# Patient Record
Sex: Male | Born: 1955
Health system: Southern US, Community
[De-identification: ages and names within clinical notes are randomized; demographics above are authoritative.]

## PROBLEM LIST (undated history)

## (undated) DIAGNOSIS — F419 Anxiety disorder, unspecified: Secondary | ICD-10-CM

## (undated) DIAGNOSIS — D751 Secondary polycythemia: Secondary | ICD-10-CM

## (undated) DIAGNOSIS — E291 Testicular hypofunction: Secondary | ICD-10-CM

## (undated) DIAGNOSIS — K760 Fatty (change of) liver, not elsewhere classified: Secondary | ICD-10-CM

## (undated) DIAGNOSIS — E785 Hyperlipidemia, unspecified: Secondary | ICD-10-CM

## (undated) DIAGNOSIS — I10 Essential (primary) hypertension: Secondary | ICD-10-CM

## (undated) DIAGNOSIS — F32A Depression, unspecified: Secondary | ICD-10-CM

## (undated) DIAGNOSIS — F1010 Alcohol abuse, uncomplicated: Secondary | ICD-10-CM

## (undated) DIAGNOSIS — M1990 Unspecified osteoarthritis, unspecified site: Secondary | ICD-10-CM

## (undated) DIAGNOSIS — F329 Major depressive disorder, single episode, unspecified: Secondary | ICD-10-CM

## (undated) DIAGNOSIS — N400 Enlarged prostate without lower urinary tract symptoms: Secondary | ICD-10-CM

## (undated) HISTORY — DX: Anxiety disorder, unspecified: F41.9

---

## 2004-09-09 ENCOUNTER — Ambulatory Visit: Payer: Self-pay | Admitting: Family Medicine

## 2004-09-30 ENCOUNTER — Ambulatory Visit: Payer: Self-pay | Admitting: Family Medicine

## 2004-10-30 ENCOUNTER — Ambulatory Visit: Payer: Self-pay | Admitting: Family Medicine

## 2005-07-01 ENCOUNTER — Ambulatory Visit: Payer: Self-pay | Admitting: Family Medicine

## 2005-07-04 ENCOUNTER — Ambulatory Visit: Payer: Self-pay | Admitting: Family Medicine

## 2005-10-14 ENCOUNTER — Ambulatory Visit: Payer: Self-pay | Admitting: Internal Medicine

## 2005-10-21 ENCOUNTER — Ambulatory Visit: Payer: Self-pay | Admitting: Internal Medicine

## 2005-12-09 ENCOUNTER — Ambulatory Visit: Payer: Self-pay | Admitting: Family Medicine

## 2006-04-08 ENCOUNTER — Encounter: Payer: Self-pay | Admitting: Internal Medicine

## 2006-04-08 DIAGNOSIS — F17200 Nicotine dependence, unspecified, uncomplicated: Secondary | ICD-10-CM

## 2006-04-09 DIAGNOSIS — F411 Generalized anxiety disorder: Secondary | ICD-10-CM | POA: Insufficient documentation

## 2006-05-14 ENCOUNTER — Ambulatory Visit: Payer: Self-pay | Admitting: Family Medicine

## 2006-05-14 DIAGNOSIS — I10 Essential (primary) hypertension: Secondary | ICD-10-CM

## 2006-05-21 ENCOUNTER — Ambulatory Visit: Payer: Self-pay | Admitting: Family Medicine

## 2006-05-25 LAB — CONVERTED CEMR LAB
AST: 23 units/L (ref 0–37)
Bilirubin, Direct: 0.1 mg/dL (ref 0.0–0.3)
CO2: 30 meq/L (ref 19–32)
Chloride: 106 meq/L (ref 96–112)
Eosinophils Absolute: 0.1 10*3/uL (ref 0.0–0.6)
Eosinophils Relative: 1.6 % (ref 0.0–5.0)
GFR calc non Af Amer: 84 mL/min
Glucose, Bld: 111 mg/dL — ABNORMAL HIGH (ref 70–99)
HCT: 49 % (ref 39.0–52.0)
Lymphocytes Relative: 22 % (ref 12.0–46.0)
MCV: 97.8 fL (ref 78.0–100.0)
Neutro Abs: 6.3 10*3/uL (ref 1.4–7.7)
Neutrophils Relative %: 68 % (ref 43.0–77.0)
RBC: 5.02 M/uL (ref 4.22–5.81)
Sodium: 143 meq/L (ref 135–145)
Testosterone: 340.83 ng/dL — ABNORMAL LOW (ref 350.00–890)
Total Protein: 6.6 g/dL (ref 6.0–8.3)
WBC: 9.2 10*3/uL (ref 4.5–10.5)

## 2006-06-15 ENCOUNTER — Encounter (INDEPENDENT_AMBULATORY_CARE_PROVIDER_SITE_OTHER): Payer: Self-pay | Admitting: Internal Medicine

## 2006-08-14 ENCOUNTER — Encounter (INDEPENDENT_AMBULATORY_CARE_PROVIDER_SITE_OTHER): Payer: Self-pay | Admitting: Internal Medicine

## 2006-08-24 ENCOUNTER — Ambulatory Visit: Payer: Self-pay | Admitting: Family Medicine

## 2006-09-01 ENCOUNTER — Ambulatory Visit: Payer: Self-pay

## 2006-09-01 ENCOUNTER — Encounter (INDEPENDENT_AMBULATORY_CARE_PROVIDER_SITE_OTHER): Payer: Self-pay | Admitting: Internal Medicine

## 2006-09-09 ENCOUNTER — Ambulatory Visit: Payer: Self-pay | Admitting: Internal Medicine

## 2006-10-07 ENCOUNTER — Ambulatory Visit: Payer: Self-pay | Admitting: Family Medicine

## 2006-10-19 ENCOUNTER — Ambulatory Visit: Payer: Self-pay | Admitting: Family Medicine

## 2006-10-20 ENCOUNTER — Encounter (INDEPENDENT_AMBULATORY_CARE_PROVIDER_SITE_OTHER): Payer: Self-pay | Admitting: Internal Medicine

## 2006-10-23 ENCOUNTER — Telehealth (INDEPENDENT_AMBULATORY_CARE_PROVIDER_SITE_OTHER): Payer: Self-pay | Admitting: Internal Medicine

## 2006-11-09 ENCOUNTER — Telehealth (INDEPENDENT_AMBULATORY_CARE_PROVIDER_SITE_OTHER): Payer: Self-pay | Admitting: Internal Medicine

## 2007-02-23 ENCOUNTER — Ambulatory Visit: Payer: Self-pay | Admitting: Family Medicine

## 2007-03-01 ENCOUNTER — Encounter (INDEPENDENT_AMBULATORY_CARE_PROVIDER_SITE_OTHER): Payer: Self-pay | Admitting: Internal Medicine

## 2007-03-08 ENCOUNTER — Encounter: Payer: Self-pay | Admitting: Family Medicine

## 2007-04-08 ENCOUNTER — Encounter (INDEPENDENT_AMBULATORY_CARE_PROVIDER_SITE_OTHER): Payer: Self-pay | Admitting: Internal Medicine

## 2007-10-07 ENCOUNTER — Encounter (INDEPENDENT_AMBULATORY_CARE_PROVIDER_SITE_OTHER): Payer: Self-pay | Admitting: Internal Medicine

## 2007-10-11 ENCOUNTER — Ambulatory Visit: Payer: Self-pay | Admitting: Family Medicine

## 2007-11-09 ENCOUNTER — Telehealth (INDEPENDENT_AMBULATORY_CARE_PROVIDER_SITE_OTHER): Payer: Self-pay | Admitting: Internal Medicine

## 2007-11-10 ENCOUNTER — Ambulatory Visit: Payer: Self-pay | Admitting: Family Medicine

## 2008-01-14 ENCOUNTER — Ambulatory Visit: Payer: Self-pay | Admitting: Oncology

## 2008-02-07 ENCOUNTER — Ambulatory Visit: Payer: Self-pay | Admitting: Oncology

## 2008-02-14 ENCOUNTER — Ambulatory Visit: Payer: Self-pay | Admitting: Oncology

## 2008-03-13 ENCOUNTER — Ambulatory Visit: Payer: Self-pay | Admitting: Oncology

## 2008-08-24 ENCOUNTER — Ambulatory Visit: Payer: Self-pay | Admitting: Family Medicine

## 2008-08-24 DIAGNOSIS — J309 Allergic rhinitis, unspecified: Secondary | ICD-10-CM | POA: Insufficient documentation

## 2008-08-24 DIAGNOSIS — J342 Deviated nasal septum: Secondary | ICD-10-CM | POA: Insufficient documentation

## 2008-09-08 ENCOUNTER — Encounter (INDEPENDENT_AMBULATORY_CARE_PROVIDER_SITE_OTHER): Payer: Self-pay | Admitting: Internal Medicine

## 2008-10-13 ENCOUNTER — Ambulatory Visit: Payer: Self-pay | Admitting: Internal Medicine

## 2008-10-19 ENCOUNTER — Encounter (INDEPENDENT_AMBULATORY_CARE_PROVIDER_SITE_OTHER): Payer: Self-pay | Admitting: Internal Medicine

## 2008-10-26 ENCOUNTER — Ambulatory Visit: Payer: Self-pay | Admitting: Family Medicine

## 2008-10-26 DIAGNOSIS — E291 Testicular hypofunction: Secondary | ICD-10-CM

## 2008-10-26 DIAGNOSIS — R7989 Other specified abnormal findings of blood chemistry: Secondary | ICD-10-CM | POA: Insufficient documentation

## 2008-11-02 ENCOUNTER — Encounter (INDEPENDENT_AMBULATORY_CARE_PROVIDER_SITE_OTHER): Payer: Self-pay | Admitting: Internal Medicine

## 2008-11-06 ENCOUNTER — Ambulatory Visit: Payer: Self-pay | Admitting: Oncology

## 2008-11-13 ENCOUNTER — Ambulatory Visit: Payer: Self-pay | Admitting: Oncology

## 2008-11-13 ENCOUNTER — Ambulatory Visit: Payer: Self-pay | Admitting: Internal Medicine

## 2008-12-13 ENCOUNTER — Ambulatory Visit: Payer: Self-pay | Admitting: Internal Medicine

## 2008-12-13 ENCOUNTER — Ambulatory Visit: Payer: Self-pay | Admitting: Oncology

## 2009-05-02 ENCOUNTER — Encounter: Payer: Self-pay | Admitting: Family Medicine

## 2009-08-14 ENCOUNTER — Telehealth (INDEPENDENT_AMBULATORY_CARE_PROVIDER_SITE_OTHER): Payer: Self-pay | Admitting: *Deleted

## 2009-08-15 ENCOUNTER — Ambulatory Visit: Payer: Self-pay | Admitting: Family Medicine

## 2009-08-17 LAB — CONVERTED CEMR LAB
ALT: 84 units/L — ABNORMAL HIGH (ref 0–53)
AST: 41 units/L — ABNORMAL HIGH (ref 0–37)
Alkaline Phosphatase: 44 units/L (ref 39–117)
Bilirubin, Direct: 0.2 mg/dL (ref 0.0–0.3)
CO2: 29 meq/L (ref 19–32)
Chloride: 102 meq/L (ref 96–112)
Cholesterol: 226 mg/dL — ABNORMAL HIGH (ref 0–200)
Potassium: 4.3 meq/L (ref 3.5–5.1)
Sodium: 141 meq/L (ref 135–145)
Total Bilirubin: 0.8 mg/dL (ref 0.3–1.2)
Total CHOL/HDL Ratio: 4
Total Protein: 6.5 g/dL (ref 6.0–8.3)

## 2009-08-21 ENCOUNTER — Ambulatory Visit: Payer: Self-pay | Admitting: Family Medicine

## 2009-08-21 DIAGNOSIS — R740 Nonspecific elevation of levels of transaminase and lactic acid dehydrogenase [LDH]: Secondary | ICD-10-CM

## 2009-08-21 DIAGNOSIS — E7800 Pure hypercholesterolemia, unspecified: Secondary | ICD-10-CM

## 2009-08-29 ENCOUNTER — Telehealth: Payer: Self-pay | Admitting: Family Medicine

## 2009-09-04 ENCOUNTER — Encounter: Payer: Self-pay | Admitting: Family Medicine

## 2009-09-04 ENCOUNTER — Ambulatory Visit: Payer: Self-pay

## 2009-09-12 ENCOUNTER — Ambulatory Visit: Payer: Self-pay | Admitting: Family Medicine

## 2009-10-02 ENCOUNTER — Ambulatory Visit: Payer: Self-pay | Admitting: Family Medicine

## 2009-10-04 ENCOUNTER — Telehealth: Payer: Self-pay | Admitting: Family Medicine

## 2009-10-31 ENCOUNTER — Ambulatory Visit: Payer: Self-pay | Admitting: Family Medicine

## 2009-11-01 ENCOUNTER — Telehealth: Payer: Self-pay | Admitting: Family Medicine

## 2009-12-03 ENCOUNTER — Encounter: Payer: Self-pay | Admitting: Family Medicine

## 2010-02-10 LAB — CONVERTED CEMR LAB
ALT: 72 units/L — ABNORMAL HIGH (ref 0–53)
Albumin: 4.3 g/dL (ref 3.5–5.2)
Basophils Relative: 0.7 % (ref 0.0–3.0)
Eosinophils Absolute: 0.1 10*3/uL (ref 0.0–0.7)
Eosinophils Relative: 1.4 % (ref 0.0–5.0)
LDL Cholesterol: 152 mg/dL
LDL Cholesterol: 152 mg/dL
Lymphocytes Relative: 19.6 % (ref 12.0–46.0)
MCHC: 34.2 g/dL (ref 30.0–36.0)
MCV: 96 fL (ref 78.0–100.0)
Monocytes Absolute: 0.9 10*3/uL (ref 0.1–1.0)
Neutrophils Relative %: 68.3 % (ref 43.0–77.0)
Platelets: 278 10*3/uL (ref 150.0–400.0)
RBC: 4.98 M/uL (ref 4.22–5.81)
Total Protein: 6.8 g/dL (ref 6.0–8.3)
WBC: 9.3 10*3/uL (ref 4.5–10.5)

## 2010-02-12 NOTE — Progress Notes (Signed)
Summary: FYI IFOB not returned  Phone Note Outgoing Call   Call placed by: Celso Sickle Call placed to: Patient Summary of Call: Recieved notice that IFOB kit has not been returned. I spoke with pt he says he just forgot to do it and will return it asap.  Tasha  Initial call taken by: Liane Comber CMA Duncan Dull),  October 04, 2009 10:27 AM

## 2010-02-12 NOTE — Assessment & Plan Note (Signed)
Summary: CPX / LFW   Vital Signs:  Patient profile:   55 year old male Height:      66.25 inches Weight:      170.0 pounds BMI:     27.33 Temp:     98.9 degrees F oral Pulse rate:   88 / minute Pulse rhythm:   regular BP sitting:   140 / 80  (left arm) Cuff size:   regular  Vitals Entered By: Benny Lennert CMA Duncan Dull) (August 21, 2009 3:44 PM)  History of Present Illness: Chief complaint cpx  The patient is here for annual wellness exam and preventative care.    Hypogonadism..seen by urology, on testosterone injections.  Feels much better on these injections.  Last PSA 04/2009...nml   Brown skin rash on face and back, left two dark spots on left face. Noted 6 months ago.  Has history of BP elevation, no dx of HTN... 156/96. Has had multiple measurments that high.  No regular exercise, central obesity.  Stress test , EKG no ischemia in 2008.    Has eight bears on weekends.   2-3 drinks daily.   Problems Prior to Update: 1)  Abnormal Electrocardiogram, ? R Vent Hypertrophy  (ICD-794.31) 2)  Transaminases, Serum, Elevated  (ICD-790.4) 3)  Prediabetes  (ICD-790.29) 4)  Screening For Lipoid Disorders  (ICD-V77.91) 5)  Hypogonadism  (ICD-257.2) 6)  Deviated Nasal Septum  (ICD-470) 7)  Allergic Rhinitis  (ICD-477.9) 8)  Erectile Dysfunction/ Dr Achilles Dunk  (ICD-302.72) 9)  Hx, Family, Other Cardiovascular Disease  (ICD-V17.49) 10)  Hypertension, Secondary, Benign  (ICD-405.19) 11)  Decreased Libido  (ICD-799.81) 12)  Anxiety  (ICD-300.00) 13)  Hx of Bronchitis, Chronic  (ICD-491.9) 14)  Tobacco Abuse  (ICD-305.1)  Current Medications (verified): 1)  Depo-Testosterone 100 Mg/ml Oil (Testosterone Cypionate) .... Q 2 Wks  Allergies: 1)  ! * Lexapro 2)  ! Buspar 3)  ! * Amitriptyline 4)  ! Wellbutrin  Past History:  Past medical, surgical, family and social histories (including risk factors) reviewed, and no changes noted (except as noted below).  Past Medical  History: Reviewed history from 04/08/2006 and no changes required. Anxiety  Family History: Reviewed history from 02/23/2007 and no changes required. Father:  Mother: died at 71 of MI, DM Siblings:1 br--bypass at 61              4 sis   DM- MI- CVA-  Prostate Cancer- Breast Cancer- Ovarian Cancer- Uterine Cancer- Colon Cancer- Drug/ ETOH Abuse- Depression-   Social History: Reviewed history from 02/23/2007 and no changes required. Marital Status: Married Children: 0 Occupation: Airline pilot  Review of Systems General:  Denies fatigue and fever. CV:  Denies chest pain or discomfort and swelling of feet. Resp:  Denies shortness of breath. GI:  Denies abdominal pain. GU:  Denies dysuria. Derm:  Complains of rash. Psych:  Complains of anxiety; denies depression. Endo:  Denies cold intolerance, excessive thirst, and heat intolerance.  Physical Exam  General:  Well-developed,well-nourished,in no acute distress; alert,appropriate and cooperative throughout examination Ears:  External ear exam shows no significant lesions or deformities.  Otoscopic examination reveals clear canals, tympanic membranes are intact bilaterally without bulging, retraction, inflammation or discharge. Hearing is grossly normal bilaterally. Nose:  External nasal examination shows no deformity or inflammation. Nasal mucosa are pink and moist without lesions or exudates. Mouth:  Oral mucosa and oropharynx without lesions or exudates.  Teeth in good repair. Neck:  no carotid bruit or thyromegaly no cervical or supraclavicular  lymphadenopathy  Lungs:  Normal respiratory effort, chest expands symmetrically. Lungs are clear to auscultation, no crackles or wheezes. Heart:  Normal rate and regular rhythm. S1 and S2 normal without gallop, murmur, click, rub or other extra sounds. Abdomen:  Bowel sounds positive,abdomen soft and non-tender without masses, organomegaly or hernias noted. Rectal:  No external  abnormalities noted. Normal sphincter tone. No rectal masses or tenderness. Genitalia:  Testes bilaterally descended without nodularity, tenderness or masses. No scrotal masses or lesions. No penis lesions or urethral discharge. Prostate:  Prostate gland firm and smooth, no enlargement, nodularity, tenderness, mass, asymmetry or induration. Pulses:  R and L posterior tibial pulses are full and equal bilaterally  Extremities:  no edema  Skin:  Brown plaque on face and back, left two darker spots on left face. Warty texture, stuck on appearance. Psych:  Cognition and judgment appear intact. Alert and cooperative with normal attention span and concentration. No apparent delusions, illusions, hallucinations   Impression & Recommendations:  Problem # 1:  Preventive Health Care (ICD-V70.0) The patient's preventative maintenance and recommended screening tests for an annual wellness exam were reviewed in full today. Brought up to date unless services declined.  Counselled on the importance of diet, exercise, and its role in overall health and mortality. The patient's FH and SH was reviewed, including their home life, tobacco status, and drug and alcohol status.     Problem # 2:  HYPERTENSION, SECONDARY, BENIGN (ICD-405.19) New diagnosis. Eval for secondary causes, end organ damage and risk factors.  Get BP cuff..measure BPS at home, goal <140/90.  Orders: EKG w/ Interpretation (93000) TLB-Hepatic/Liver Function Pnl (80076-HEPATIC) TLB-TSH (Thyroid Stimulating Hormone) (84443-TSH) TLB-CBC Platelet - w/Differential (85025-CBCD)  Problem # 3:  ABNORMAL ELECTROCARDIOGRAM, ? R VENT HYPERTROPHY (ICD-794.31)  Orders: Echo Referral (Echo)  Problem # 4:  TRANSAMINASES, SERUM, ELEVATED (ICD-790.4) Discussed decreasing ETOH beverages. Avoiding tylenol. Recehk in 3 months.Marland Kitchenif persistantly elevated eval further.   Hepatic Profile:reviewed last results: SGPT: 84 (08/15/2009 9:23:37 AM) SGOT:41  (08/15/2009 9:23:37 AM) Alk Phos: 44 (08/15/2009 9:23:37 AM) Indirect bilirubin:  Direct bilirubin: 0.2 (08/15/2009 9:23:37 AM) Total bilirubin: 0.8 (08/15/2009 9:23:37 AM) Total protein: 6.5 (08/15/2009 9:23:37 AM)   Problem # 5:  PREDIABETES (ICD-790.29)  Orders: EKG w/ Interpretation (93000) TLB-Hepatic/Liver Function Pnl (80076-HEPATIC) TLB-TSH (Thyroid Stimulating Hormone) (84443-TSH) TLB-CBC Platelet - w/Differential (85025-CBCD)  Labs Reviewed: Creat: 0.8 (08/15/2009)     Problem # 6:  HYPERCHOLESTEROLEMIA (ICD-272.0) Encouraged exercise, weight loss, healthy eating habits. Reviewed low cholesterol diet.  Labs Reviewed: SGOT: 41 (08/15/2009)   SGPT: 84 (08/15/2009)   HDL:54.60 (08/15/2009), 51.5 (05/21/2006)  LDL:152 (08/15/2009), 152 (08/15/2009)  Chol:226 (08/15/2009), 223 (05/21/2006)  Trig:191.0 (08/15/2009), 121 (05/21/2006)  Complete Medication List: 1)  Depo-testosterone 100 Mg/ml Oil (Testosterone cypionate) .... Q 2 wks  Patient Instructions: 1)  Look into eating more 2 % cheeses, decrease fried foods. 2)  Make diet changes to lower chol., low animal fats. 3)  Low salt diet. 4)  Start  exercise.Marland Kitchengoal 3-5 times a week, 20-30 min. 5)  Decrease alcoholic beverages. 6)  Recheck fasting LIPIDS, CMET  in 3 months Dx 272.0  7)  Please schedule a follow-up appointment in 2 weeks HTN 30 min.  8)  Get BP cuff..measure BPS at home, goal <140/90. 9)  Referral Appointment Information 10)  Day/Date: 11)  Time: 12)  Place/MD: 13)  Address: 14)  Phone/Fax: 15)  Patient given appointment information. Information/Orders faxed/mailed.   Current Allergies (reviewed today): ! * LEXAPRO ! BUSPAR ! *  AMITRIPTYLINE ! WELLBUTRIN  Last HDL:  54.60 (08/15/2009 9:23:37 AM) HDL Next Due:  12 wk Last LDL:  DEL (05/21/2006 8:51:00 AM) LDL Result Date:  08/15/2009 LDL Result:  152 LDL Next Due:  12 wk Flex Sig Next Due:  Not Indicated Colonoscopy Next Due:  Refused PSA Result  Date:  04/13/2009 PSA Result:  per urology PSA Next Due:  1 yr

## 2010-02-12 NOTE — Letter (Signed)
Summary: [facility]   Imported By: Lanelle Bal 12/13/2009 11:57:40  _____________________________________________________________________  External Attachment:    Type:   Image     Comment:   External Document

## 2010-02-12 NOTE — Progress Notes (Signed)
  Phone Note Outgoing Call   Call placed by: twalsh Call placed to: Patient Details for Reason: ifob not returned Summary of Call: Ifob never returned, per [facility]. This was his second kit  Test cancelled. Initial call taken by: Mills Koller,  November 01, 2009 3:31 PM     Hemoccult Next Due:  Refused

## 2010-02-12 NOTE — Letter (Signed)
Summary: [facility]   Imported By: Lanelle Bal 05/08/2009 10:45:58  _____________________________________________________________________  External Attachment:    Type:   Image     Comment:   External Document

## 2010-02-12 NOTE — Progress Notes (Signed)
----   Converted from flag ---- ---- 08/14/2009 2:00 PM, Kerby Nora MD wrote: CMET, lipids Dx v77.91   ---- 08/14/2009 1:23 PM, Liane Comber CMA (AAMA) wrote: Pt is scheduled for cpx labs tomorrow, what labs to draw and dx codes? Thanks Tasha ------------------------------

## 2010-02-12 NOTE — Progress Notes (Signed)
Summary: regarding lisinopril/ hctz  Phone Note From Pharmacy   Caller: [facility] Summary of Call: Pharmacist called regarding lisinopril strength.  He says this does not come in 10/25.  It comes in 10/12.5 and 20/25.  Please advise on what to change to. Initial call taken by: Lowella Petties CMA,  August 29, 2009 2:50 PM  Follow-up for Phone Call        dr Ermalene Searing verified 10/12.5 Follow-up by: Benny Lennert CMA Duncan Dull),  August 29, 2009 3:08 PM

## 2010-02-12 NOTE — Assessment & Plan Note (Signed)
Summary: ROA 30 MINUTES IN 2 MTHS CYD   Vital Signs:  Patient profile:   55 year old male Height:      66.25 inches Weight:      170 pounds BMI:     27.33 Temp:     98.4 degrees F oral Pulse rate:   76 / minute Pulse rhythm:   regular BP sitting:   110 / 60  (left arm) Cuff size:   regular  Vitals Entered By: Linde Gillis CMA Duncan Dull) (September 12, 2009 3:25 PM) CC: return office visit, 30 minute appt, Hypertension Management   History of Present Illness: HTN..improved control on lisinopril HCTZ low dose...has been on this for 2 weeks.  BPs 126/76-143/74 Headches better. No SE to medication.  Feels better overall.  Sleeping better at night.  ECHO nml..nml %EF  LFTs improved last check ..on less ETOH...not nml though.  Rechk in 3 months.   Hypertension History:      Positive major cardiovascular risk factors include male age 69 years old or older, hyperlipidemia, hypertension, and current tobacco user.     Problems Prior to Update: 1)  Hypercholesterolemia  (ICD-272.0) 2)  Preventive Health Care  (ICD-V70.0) 3)  Abnormal Electrocardiogram, ? R Vent Hypertrophy  (ICD-794.31) 4)  Transaminases, Serum, Elevated  (ICD-790.4) 5)  Prediabetes  (ICD-790.29) 6)  Screening For Lipoid Disorders  (ICD-V77.91) 7)  Hypogonadism  (ICD-257.2) 8)  Deviated Nasal Septum  (ICD-470) 9)  Allergic Rhinitis  (ICD-477.9) 10)  Erectile Dysfunction/ Dr Achilles Dunk  (ICD-302.72) 11)  Hx, Family, Other Cardiovascular Disease  (ICD-V17.49) 12)  Hypertension, Secondary, Benign  (ICD-405.19) 13)  Decreased Libido  (ICD-799.81) 14)  Anxiety  (ICD-300.00) 15)  Hx of Bronchitis, Chronic  (ICD-491.9) 16)  Tobacco Abuse  (ICD-305.1)  Current Medications (verified): 1)  Depo-Testosterone 100 Mg/ml Oil (Testosterone Cypionate) .... Q 2 Wks 2)  Prinzide 10-12.5 Mg Tabs (Lisinopril-Hydrochlorothiazide) .... Take One Tablet By Mouth Daily  Allergies: 1)  ! * Lexapro 2)  ! Buspar 3)  ! * Amitriptyline 4)   ! Wellbutrin  Past History:  Past medical, surgical, family and social histories (including risk factors) reviewed, and no changes noted (except as noted below).  Past Medical History: Reviewed history from 04/08/2006 and no changes required. Anxiety  Family History: Reviewed history from 02/23/2007 and no changes required. Father:  Mother: died at 2 of MI, DM Siblings:1 br--bypass at 28              4 sis   DM- MI- CVA-  Prostate Cancer- Breast Cancer- Ovarian Cancer- Uterine Cancer- Colon Cancer- Drug/ ETOH Abuse- Depression-   Social History: Reviewed history from 02/23/2007 and no changes required. Marital Status: Married Children: 0 Occupation: Oncologist Exam  General:  Well-developed,well-nourished,in no acute distress; alert,appropriate and cooperative throughout examination Mouth:  Oral mucosa and oropharynx without lesions or exudates.  Teeth in good repair. Neck:  no carotid bruit or thyromegaly no cervical or supraclavicular lymphadenopathy  Lungs:  Normal respiratory effort, chest expands symmetrically. Lungs are clear to auscultation, no crackles or wheezes. Heart:  Normal rate and regular rhythm. S1 and S2 normal without gallop, murmur, click, rub or other extra sounds. Pulses:  R and L posterior tibial pulses are full and equal bilaterally  Extremities:  no edema    Impression & Recommendations:  Problem # 1:  HYPERTENSION, SECONDARY, BENIGN (ICD-405.19) Improved on current medication. Encouraged exercise, weight loss, healthy eating habits.  His updated medication list for this problem includes:  Prinzide 10-12.5 Mg Tabs (Lisinopril-hydrochlorothiazide) .Marland Kitchen... Take one tablet by mouth daily  Problem # 2:  TRANSAMINASES, SERUM, ELEVATED (ICD-790.4) IMproved with decrease in LFTs. Continue to recehck in 3 months.  Problem # 3:  ABNORMAL ELECTROCARDIOGRAM, ? R VENT HYPERTROPHY (ICD-794.31) No hypertrophy on ECHO. Nml EF%  Complete  Medication List: 1)  Depo-testosterone 100 Mg/ml Oil (Testosterone cypionate) .... Q 2 wks 2)  Prinzide 10-12.5 Mg Tabs (Lisinopril-hydrochlorothiazide) .... Take one tablet by mouth daily  Other Orders: Admin 1st Vaccine (04540) Flu Vaccine 74yrs + (98119)  Hypertension Assessment/Plan:      The patient's hypertensive risk group is category B: At least one risk factor (excluding diabetes) with no target organ damage.  His calculated 10 year risk of coronary heart disease is 11 %.  Today's blood pressure is 110/60.  His blood pressure goal is < 140/90.  Patient Instructions: 1)  Look into eating more 2 % cheeses, decrease fried foods. 2)  Make diet changes to lower chol., low animal fats. 3)   Low salt diet. 4)  Start  exercise.Marland Kitchengoal 3-5 times a week, 20-30 min. 5)   Decrease alcoholic beverages. 6)   Recheck fasting LIPIDS, CMET  in 3 months Dx 272.0    Flu Vaccine Consent Questions     Do you have a history of severe allergic reactions to this vaccine? no    Any prior history of allergic reactions to egg and/or gelatin? no    Do you have a sensitivity to the preservative Thimersol? no    Do you have a past history of Guillan-Barre Syndrome? no    Do you currently have an acute febrile illness? no    Have you ever had a severe reaction to latex? no    Vaccine information given and explained to patient? yes    Are you currently pregnant? no    Lot Number:[id number]   Exp Date:07/13/2010   Site Given  Left Deltoid IM  Current Allergies (reviewed today): ! * LEXAPRO ! BUSPAR ! * AMITRIPTYLINE ! WELLBUTRIN  .lbflu

## 2010-02-12 NOTE — Progress Notes (Signed)
Summary: BP has been elevated  Phone Note Call from Patient Call back at Work Phone [phone]   Caller: Patient Summary of Call: Pt reports that BP has been up the last few day-.  Readings have been 176/96, 167/96, and 205/100.  He was told to track his BP and let you know.  Uses [facility] [address]. Initial call taken by: Lowella Petties CMA,  August 29, 2009 11:38 AM  Follow-up for Phone Call        Recommend starting a medication...lisinopril HCTZ 10/25 1 tab by mouth daily. #30, 3 RF  Continue to follow BP. Come in for BMET in 7-10 day Dx 401.1 to make sure no changes in kidney function with new med.  AMke follow up BP check appt in 2-3 weeks.   Follow-up by: Kerby Nora MD,  August 29, 2009 2:11 PM  Additional Follow-up for Phone Call Additional follow up Details #1::        Patient advised and rx called in Additional Follow-up by: Benny Lennert CMA Duncan Dull),  August 29, 2009 2:44 PM     Current Allergies (reviewed today): ! * LEXAPRO ! BUSPAR ! * AMITRIPTYLINE ! WELLBUTRIN

## 2010-02-21 ENCOUNTER — Ambulatory Visit (INDEPENDENT_AMBULATORY_CARE_PROVIDER_SITE_OTHER): Payer: Managed Care, Other (non HMO) | Admitting: Family Medicine

## 2010-02-21 ENCOUNTER — Encounter: Payer: Self-pay | Admitting: Family Medicine

## 2010-02-21 DIAGNOSIS — E7800 Pure hypercholesterolemia, unspecified: Secondary | ICD-10-CM

## 2010-02-21 DIAGNOSIS — R7309 Other abnormal glucose: Secondary | ICD-10-CM

## 2010-02-21 DIAGNOSIS — I159 Secondary hypertension, unspecified: Secondary | ICD-10-CM

## 2010-02-28 NOTE — Assessment & Plan Note (Signed)
Summary: BP MEDS REFILL / LFW   Vital Signs:  Patient profile:   55 year old male Height:      66.25 inches Weight:      170.75 pounds BMI:     27.45 Temp:     97.7 degrees F oral Pulse rate:   72 / minute Pulse rhythm:   regular BP sitting:   110 / 70  (left arm) Cuff size:   regular  Vitals Entered By: Benny Lennert CMA Duncan Dull) (February 21, 2010 11:24 AM)  History of Present Illness: Chief complaint recheck bp   HTN, well controlled on current meds.  Usually at home 110-136/70-88  Elevated LFTS.Marland Kitchen due for recheck.   Continues alcohol use but less now... but still when gets to gether with friends drinks a lot.. such as at [employer].   High cholesterol.. struggling with diet changes, exercsie 1-2 times a week.. walking.   Problems Prior to Update: 1)  Hypercholesterolemia  (ICD-272.0) 2)  Preventive Health Care  (ICD-V70.0) 3)  Abnormal Electrocardiogram, ? R Vent Hypertrophy  (ICD-794.31) 4)  Transaminases, Serum, Elevated  (ICD-790.4) 5)  Prediabetes  (ICD-790.29) 6)  Screening For Lipoid Disorders  (ICD-V77.91) 7)  Hypogonadism  (ICD-257.2) 8)  Deviated Nasal Septum  (ICD-470) 9)  Allergic Rhinitis  (ICD-477.9) 10)  Erectile Dysfunction/ Dr Achilles Dunk  (ICD-302.72) 11)  Hx, Family, Other Cardiovascular Disease  (ICD-V17.49) 12)  Hypertension, Secondary, Benign  (ICD-405.19) 13)  Decreased Libido  (ICD-799.81) 14)  Anxiety  (ICD-300.00) 15)  Hx of Bronchitis, Chronic  (ICD-491.9) 16)  Tobacco Abuse  (ICD-305.1)  Current Medications (verified): 1)  Depo-Testosterone 100 Mg/ml Oil (Testosterone Cypionate) .... Q 2 Wks 2)  Prinzide 10-12.5 Mg Tabs (Lisinopril-Hydrochlorothiazide) .... Take One Tablet By Mouth Daily  Allergies: 1)  ! * Lexapro 2)  ! Buspar 3)  ! * Amitriptyline 4)  ! Wellbutrin  Past History:  Past medical, surgical, family and social histories (including risk factors) reviewed, and no changes noted (except as noted below).  Past Medical  History: Reviewed history from 04/08/2006 and no changes required. Anxiety  Family History: Reviewed history from 02/23/2007 and no changes required. Father:  Mother: died at 55 of MI, DM Siblings:1 br--bypass at 47              4 sis   DM- MI- CVA-  Prostate Cancer- Breast Cancer- Ovarian Cancer- Uterine Cancer- Colon Cancer- Drug/ ETOH Abuse- Depression-   Social History: Reviewed history from 02/23/2007 and no changes required. Marital Status: Married Children: 0 Occupation: Airline pilot  Review of Systems General:  Denies fatigue and fever. CV:  Denies chest pain or discomfort. Resp:  Denies shortness of breath. GI:  Denies abdominal pain.  Physical Exam  General:  Overweight malel IN NAD.Marland Kitchen central obesity Nose:  External nasal examination shows deviated septum Nasal mucosa are pink and moist without lesions or exudates. Mouth:  MMM Neck:  no carotid bruit or thyromegaly no cervical or supraclavicular lymphadenopathy  Lungs:  Normal respiratory effort, chest expands symmetrically. Lungs are clear to auscultation, no crackles or wheezes. Heart:  Normal rate and regular rhythm. S1 and S2 normal without gallop, murmur, click, rub or other extra sounds. Abdomen:  Bowel sounds positive,abdomen soft and non-tender without masses, organomegaly or hernias noted. Central obesity Pulses:  R and L posterior tibial pulses are full and equal bilaterally  Extremities:  no edema   Impression & Recommendations:  Problem # 1:  HYPERTENSION, SECONDARY, BENIGN (ICD-405.19) Assessment Improved Well controlled. Continue  current medication.  His updated medication list for this problem includes:    Prinzide 10-12.5 Mg Tabs (Lisinopril-hydrochlorothiazide) .Marland Kitchen... Take one tablet by mouth daily  Problem # 2:  HYPERCHOLESTEROLEMIA (ICD-272.0) Due for reeval.   Problem # 3:  PREDIABETES (ICD-790.29) Due for reeval.   Problem # 4:  TRANSAMINASES, SERUM, ELEVATED (ICD-790.4) Due for  reeval.  Likely due to alcohol use/fatty liver.   Complete Medication List: 1)  Depo-testosterone 100 Mg/ml Oil (Testosterone cypionate) .... Q 2 wks 2)  Prinzide 10-12.5 Mg Tabs (Lisinopril-hydrochlorothiazide) .... Take one tablet by mouth daily  Patient Instructions: 1)  Fasting lipids, CMET Dx 272.0 NOW 2)  Schedule CPX in 08/2010  Prescriptions: PRINZIDE 10-12.5 MG TABS (LISINOPRIL-HYDROCHLOROTHIAZIDE) take one tablet by mouth daily  #30 Each x 11   Entered and Authorized by:   Kerby Nora MD   Signed by:   Kerby Nora MD on 02/21/2010   Method used:   Electronically to        [facility] (retail)       [address], [address]       [place], [place]  [address]       Ph: [phone]       Fax: [phone]   RxID:   [id number]    Orders Added: 1)  Est. Patient Level IV [16606]    Current Allergies (reviewed today): ! * LEXAPRO ! BUSPAR ! * AMITRIPTYLINE ! WELLBUTRIN

## 2010-08-06 ENCOUNTER — Encounter: Payer: Self-pay | Admitting: Cardiology

## 2010-11-16 ENCOUNTER — Telehealth: Payer: Self-pay | Admitting: Family Medicine

## 2010-11-16 DIAGNOSIS — I159 Secondary hypertension, unspecified: Secondary | ICD-10-CM

## 2010-11-16 DIAGNOSIS — Z125 Encounter for screening for malignant neoplasm of prostate: Secondary | ICD-10-CM

## 2010-11-16 DIAGNOSIS — E7800 Pure hypercholesterolemia, unspecified: Secondary | ICD-10-CM

## 2010-11-16 DIAGNOSIS — R7309 Other abnormal glucose: Secondary | ICD-10-CM

## 2010-11-16 NOTE — Telephone Encounter (Signed)
Message copied by Excell Seltzer on Sat Nov 16, 2010  9:14 AM ------      Message from: Alvina Chou      Created: Fri Nov 15, 2010  3:26 PM      Regarding: Labs for ZOXWRU,04-5       Patient is scheduled for CPX labs, please order future labs, Thanks , Camelia Eng

## 2010-11-18 ENCOUNTER — Other Ambulatory Visit (INDEPENDENT_AMBULATORY_CARE_PROVIDER_SITE_OTHER): Payer: Managed Care, Other (non HMO)

## 2010-11-18 DIAGNOSIS — E7800 Pure hypercholesterolemia, unspecified: Secondary | ICD-10-CM

## 2010-11-18 DIAGNOSIS — Z125 Encounter for screening for malignant neoplasm of prostate: Secondary | ICD-10-CM

## 2010-11-18 DIAGNOSIS — R7309 Other abnormal glucose: Secondary | ICD-10-CM

## 2010-11-18 DIAGNOSIS — I159 Secondary hypertension, unspecified: Secondary | ICD-10-CM

## 2010-11-18 LAB — LIPID PANEL
Cholesterol: 187 mg/dL (ref 0–200)
LDL Cholesterol: 121 mg/dL — ABNORMAL HIGH (ref 0–99)
Total CHOL/HDL Ratio: 4
Triglycerides: 103 mg/dL (ref 0.0–149.0)

## 2010-11-18 LAB — COMPREHENSIVE METABOLIC PANEL
ALT: 39 U/L (ref 0–53)
AST: 22 U/L (ref 0–37)
Albumin: 4 g/dL (ref 3.5–5.2)
BUN: 18 mg/dL (ref 6–23)
CO2: 30 mEq/L (ref 19–32)
Calcium: 9.7 mg/dL (ref 8.4–10.5)
Chloride: 102 mEq/L (ref 96–112)
Creatinine, Ser: 1 mg/dL (ref 0.4–1.5)
GFR: 83.58 mL/min (ref >60.00)
Potassium: 4.5 mEq/L (ref 3.5–5.1)

## 2010-11-25 ENCOUNTER — Encounter: Payer: Self-pay | Admitting: Family Medicine

## 2010-11-25 ENCOUNTER — Ambulatory Visit (INDEPENDENT_AMBULATORY_CARE_PROVIDER_SITE_OTHER): Payer: Managed Care, Other (non HMO) | Admitting: Family Medicine

## 2010-11-25 VITALS — BP 120/72 | HR 77 | Temp 97.9°F | Ht 68.0 in | Wt 167.0 lb

## 2010-11-25 DIAGNOSIS — E291 Testicular hypofunction: Secondary | ICD-10-CM

## 2010-11-25 DIAGNOSIS — E7800 Pure hypercholesterolemia, unspecified: Secondary | ICD-10-CM

## 2010-11-25 DIAGNOSIS — R7402 Elevation of levels of lactic acid dehydrogenase (LDH): Secondary | ICD-10-CM

## 2010-11-25 DIAGNOSIS — Z0000 Encounter for general adult medical examination without abnormal findings: Secondary | ICD-10-CM

## 2010-11-25 DIAGNOSIS — Z1211 Encounter for screening for malignant neoplasm of colon: Secondary | ICD-10-CM

## 2010-11-25 DIAGNOSIS — J309 Allergic rhinitis, unspecified: Secondary | ICD-10-CM

## 2010-11-25 DIAGNOSIS — R7309 Other abnormal glucose: Secondary | ICD-10-CM

## 2010-11-25 DIAGNOSIS — I159 Secondary hypertension, unspecified: Secondary | ICD-10-CM

## 2010-11-25 MED ORDER — FLUTICASONE PROPIONATE 50 MCG/ACT NA SUSP: 2.0000 | Freq: Every day | NASAL | Status: DC

## 2010-11-25 NOTE — Assessment & Plan Note (Signed)
Improved with ETOH decrease.

## 2010-11-25 NOTE — Assessment & Plan Note (Signed)
Continued issue. Counseled on diet changes, exercise and further weight loss.

## 2010-11-25 NOTE — Assessment & Plan Note (Signed)
Per Uro on testosterone patches.

## 2010-11-25 NOTE — Progress Notes (Signed)
Subjective:    Patient ID: Andrew Farrell, male    DOB: 1955-10-15, 55 y.o.   MRN: [id number]  HPI  The patient is here for annual wellness exam and preventative care.    Hypertension:   Well controlled on lisinopril/HCTZ. Using medication without problems or lightheadedness: None Chest pain with exertion: None Edema:None Short of breath:None Average home BPs: 140-143/83-84 at home. Other issues: Has felt great since quit smoking in last 2 years.  ECHO: nml last year, nml EF.  Alcohol.. Continued use.. 2 beers a day, not drinking as much on weekneds as in past.   Elevated Cholesterol: At goal now LDL <130, trig at goal. Diet compliance: Moderate, has lost 3 lbs in in last 6 months. Exercise:None Other complaints:  Nasal congestion severe, has deviated septum.. Limits sleeping at night. Using sinex now, has helped some. Has never tried nasal steroid in past.  Claritin has not helped in past.  Prediabetes:  Continues.  Hypogonadism..seen by urology, on testosterone injections.  Feels much better on these injections.  On vitamin E for curvature in penis.   Review of Systems  Constitutional: Negative for fever, fatigue and unexpected weight change.  HENT: Negative for ear pain, congestion, sore throat, rhinorrhea, trouble swallowing and postnasal drip.   Eyes: Negative for pain.  Respiratory: Negative for cough, shortness of breath and wheezing.   Cardiovascular: Negative for chest pain, palpitations and leg swelling.  Gastrointestinal: Negative for nausea, abdominal pain, diarrhea, constipation and blood in stool.  Genitourinary: Negative for dysuria, urgency, hematuria, discharge, penile swelling, scrotal swelling, difficulty urinating, penile pain and testicular pain.  Skin: Negative for rash.  Neurological: Negative for syncope, weakness, light-headedness, numbness and headaches.  Psychiatric/Behavioral: Negative for behavioral problems and dysphoric mood. The patient  is not nervous/anxious.        Objective:   Physical Exam  Constitutional: He is oriented to person, place, and time. He appears well-developed and well-nourished.  Non-toxic appearance. He does not appear ill. No distress.       Central obesity  HENT:  Head: Normocephalic and atraumatic.  Right Ear: Hearing, tympanic membrane, external ear and ear canal normal.  Left Ear: Hearing, tympanic membrane, external ear and ear canal normal.  Nose: Mucosal edema, rhinorrhea and septal deviation present. No nasal septal hematoma. Right sinus exhibits no maxillary sinus tenderness and no frontal sinus tenderness. Left sinus exhibits no maxillary sinus tenderness and no frontal sinus tenderness.  Mouth/Throat: Uvula is midline, oropharynx is clear and moist and mucous membranes are normal.  Eyes: Conjunctivae, EOM and lids are normal. Pupils are equal, round, and reactive to light. No foreign bodies found.  Neck: Trachea normal, normal range of motion and phonation normal. Neck supple. Carotid bruit is not present. No mass and no thyromegaly present.  Cardiovascular: Normal rate, regular rhythm, S1 normal, S2 normal, intact distal pulses and normal pulses.  Exam reveals no gallop.   No murmur heard. Pulmonary/Chest: Breath sounds normal. He has no wheezes. He has no rhonchi. He has no rales.  Abdominal: Soft. Normal appearance and bowel sounds are normal. There is no hepatosplenomegaly. There is no tenderness. There is no rebound, no guarding and no CVA tenderness. No hernia.  Genitourinary:       Per uro  Lymphadenopathy:    He has no cervical adenopathy.  Neurological: He is alert and oriented to person, place, and time. He has normal strength and normal reflexes. No cranial nerve deficit or sensory deficit. Gait normal.  Skin:  Skin is warm, dry and intact. No rash noted.  Psychiatric: He has a normal mood and affect. His speech is normal and behavior is normal. Judgment and thought content normal.           Assessment & Plan:  Complete Physical EXAM: The patient's preventative maintenance and recommended screening tests for an annual wellness exam were reviewed in full today. Brought up to date unless services declined.  Counselled on the importance of diet, exercise, and its role in overall health and mortality. The patient's FH and SH was reviewed, including their home life, tobacco status, and drug and alcohol status.   Vaccines: Flu given, Tdap:DUE, refused today. PSA: nml, rectal at U[facility]. Colon: Ready for colonoscopy. Will schedule.

## 2010-11-25 NOTE — Patient Instructions (Addendum)
Trial of generic flonase for nasal congestion.  Work on increasing exercise, weight loss and decreasing alcohol intake.  Decrease  Carbohydrates in diet.Marland Kitchen BREAD, pasta, pastry, sweets.  Stop at front to talk with Shirlee Limerick about referral for colonoscopy.  Follow up for CPX in 1 year with fasting labs prior.

## 2010-11-25 NOTE — Assessment & Plan Note (Signed)
Trial of nasal steroid for congestion. Significant issues from deviated septum, but not interested in repair at this time.

## 2010-11-25 NOTE — Assessment & Plan Note (Signed)
IMproved control, LDL now at goal with diet change.

## 2010-11-25 NOTE — Assessment & Plan Note (Signed)
Well controlled. Continue current medication.  

## 2011-03-18 ENCOUNTER — Ambulatory Visit: Payer: Self-pay | Admitting: Gastroenterology

## 2011-03-18 LAB — HM COLONOSCOPY: HM Colonoscopy: ABNORMAL

## 2011-03-24 ENCOUNTER — Other Ambulatory Visit: Payer: Self-pay | Admitting: Family Medicine

## 2011-03-25 ENCOUNTER — Encounter: Payer: Self-pay | Admitting: Family Medicine

## 2011-04-08 ENCOUNTER — Encounter: Payer: Self-pay | Admitting: Family Medicine

## 2012-02-16 ENCOUNTER — Other Ambulatory Visit: Payer: Self-pay | Admitting: Family Medicine

## 2012-06-22 ENCOUNTER — Ambulatory Visit (INDEPENDENT_AMBULATORY_CARE_PROVIDER_SITE_OTHER): Payer: BC Managed Care – PPO | Admitting: Family Medicine

## 2012-06-22 ENCOUNTER — Encounter: Payer: Self-pay | Admitting: Family Medicine

## 2012-06-22 VITALS — BP 140/80 | HR 85 | Temp 97.6°F | Ht 68.0 in | Wt 168.0 lb

## 2012-06-22 DIAGNOSIS — F411 Generalized anxiety disorder: Secondary | ICD-10-CM

## 2012-06-22 DIAGNOSIS — F329 Major depressive disorder, single episode, unspecified: Secondary | ICD-10-CM

## 2012-06-22 DIAGNOSIS — F3289 Other specified depressive episodes: Secondary | ICD-10-CM

## 2012-06-22 MED ORDER — LORAZEPAM 1 MG PO TABS: 1.0000 mg | ORAL_TABLET | Freq: Three times a day (TID) | ORAL | Status: DC

## 2012-06-22 MED ORDER — FLUOXETINE HCL 20 MG PO TABS: 20.0000 mg | ORAL_TABLET | Freq: Every day | ORAL | Status: DC

## 2012-06-22 NOTE — Progress Notes (Signed)
[facility] at [facility] [address] [place] [address] Phone: [phone] Fax: [phone]  Date:  06/22/2012   Name:  Andrew Farrell   DOB:  [birth year]   MRN:  [id number] Gender: male Age: 57 y.o.  Primary Physician:  Kerby Nora, MD  Evaluating MD: Hannah Beat, MD   Chief Complaint: Anxiety   History of Present Illness:  Andrew Farrell is a 57 y.o. pleasant patient who presents with the following: Even 6 mo. Having bad anxiety. Even when laying down. Sometimes feel like cannot breath. On and off for the last year. Dx with peyronie's. Lost job in December. Now tryin to. A lot of stress with wife. Wife is emotional. Always had some trouble sleeping. Sometimes panic attacks at night. Some guilt and decreased energy.     Patient Active Problem List   Diagnosis Date Noted  . HYPERCHOLESTEROLEMIA 08/21/2009  . PREDIABETES 08/21/2009  . TRANSAMINASES, SERUM, ELEVATED 08/21/2009  . HYPOGONADISM 10/26/2008  . DEVIATED NASAL SEPTUM 08/24/2008  . ALLERGIC RHINITIS 08/24/2008  . HYPERTENSION, SECONDARY, BENIGN 05/14/2006  . ANXIETY 04/09/2006  . TOBACCO ABUSE 04/08/2006    Past Medical History  Diagnosis Date  . Anxiety     No past surgical history on file.  History   Social History  . Marital Status: Married    Spouse Name: N/A    Number of Children: N/A  . Years of Education: N/A   Occupational History  . Not on file.   Social History Main Topics  . Smoking status: Former Games developer  . Smokeless tobacco: Not on file  . Alcohol Use: Not on file  . Drug Use: Not on file  . Sexually Active: Not on file   Other Topics Concern  . Not on file   Social History Narrative   Married, Airline pilot.     No family history on file.  Allergies  Allergen Reactions  . Bupropion Hcl     REACTION: unspecified  . Buspirone Hcl     REACTION: severe ha  . Escitalopram Oxalate     REACTION: made negative    Medication list has been reviewed  and updated.  Outpatient Prescriptions Prior to Visit  Medication Sig Dispense Refill  . lisinopril-hydrochlorothiazide (PRINZIDE,ZESTORETIC) 10-12.5 MG per tablet TAKE ONE TABLET BY MOUTH EVERY DAY  30 tablet  8  . fluticasone (FLONASE) 50 MCG/ACT nasal spray Place 2 sprays into the nose daily.  1 g  0  . testosterone cypionate (DEPO-TESTOSTERONE) 100 MG/ML injection Inject 100 mg into the muscle every 14 (fourteen) days.        . vitamin E 1000 UNIT capsule Take 1,000 Units by mouth daily.         No facility-administered medications prior to visit.    Review of Systems:   GEN: No acute illnesses, no fevers, chills. GI: No n/v/d, eating normally Pulm: No SOB Interactive and getting along well at home.  Otherwise, ROS is as per the HPI.   Physical Examination: BP 140/80  Pulse 85  Temp(Src) 97.6 F (36.4 C) (Oral)  Ht 5\' 8"  (1.727 m)  Wt 168 lb (76.204 kg)  BMI 25.55 kg/m2  SpO2 97%  Ideal Body Weight: Weight in (lb) to have BMI = 25: 164.1   GEN: WDWN, NAD, Non-toxic, Alert & Oriented x 3 HEENT: Atraumatic, Normocephalic.  Ears and Nose: No external deformity. EXTR: No clubbing/cyanosis/edema NEURO: Normal gait.  PSYCH: Normally interactive. Anxious.   Assessment and Plan:  ANXIETY  Depression  >25 minutes spent in face to face time with patient, >50% spent in counselling or coordination of care: depressed and anxious, feeling guilty.  Decreased energy. Poor sleep. No si or hi. Having some panic attacks. Worry about no job, peyronie's dz. Start prozac, for now prn ativan.   F/u Dr. B in 1 mo  Orders Today:  No orders of the defined types were placed in this encounter.    Updated Medication List: (Includes new medications, updates to list, dose adjustments) Meds ordered this encounter  Medications  . FLUoxetine (PROZAC) 20 MG tablet    Sig: Take 1 tablet (20 mg total) by mouth daily.    Dispense:  30 tablet    Refill:  1  . LORazepam (ATIVAN) 1 MG  tablet    Sig: Take 1 tablet (1 mg total) by mouth every 8 (eight) hours.    Dispense:  30 tablet    Refill:  0    Medications Discontinued: Medications Discontinued During This Encounter  Medication Reason  . fluticasone (FLONASE) 50 MCG/ACT nasal spray Error  . testosterone cypionate (DEPO-TESTOSTERONE) 100 MG/ML injection Error  . vitamin E 1000 UNIT capsule Error      Signed, Kai Calico T. Shavelle Runkel, MD 06/22/2012 12:39 PM

## 2012-07-14 ENCOUNTER — Encounter: Payer: Self-pay | Admitting: *Deleted

## 2012-07-15 ENCOUNTER — Encounter: Payer: Self-pay | Admitting: Family Medicine

## 2012-07-15 ENCOUNTER — Ambulatory Visit: Payer: BC Managed Care – PPO | Admitting: Family Medicine

## 2012-07-15 ENCOUNTER — Ambulatory Visit (INDEPENDENT_AMBULATORY_CARE_PROVIDER_SITE_OTHER): Payer: BC Managed Care – PPO | Admitting: Family Medicine

## 2012-07-15 ENCOUNTER — Ambulatory Visit: Payer: Self-pay | Admitting: Oncology

## 2012-07-15 VITALS — BP 130/74 | HR 92 | Temp 98.2°F | Ht 68.0 in | Wt 162.5 lb

## 2012-07-15 DIAGNOSIS — F411 Generalized anxiety disorder: Secondary | ICD-10-CM

## 2012-07-15 DIAGNOSIS — F325 Major depressive disorder, single episode, in full remission: Secondary | ICD-10-CM | POA: Insufficient documentation

## 2012-07-15 DIAGNOSIS — F329 Major depressive disorder, single episode, unspecified: Secondary | ICD-10-CM

## 2012-07-15 DIAGNOSIS — N486 Induration penis plastica: Secondary | ICD-10-CM

## 2012-07-15 NOTE — Assessment & Plan Note (Signed)
Improved

## 2012-07-15 NOTE — Assessment & Plan Note (Signed)
Pt is uncomfortable with lack of treatment options presnet with his current urologist.  We will refer to different  urologist

## 2012-07-15 NOTE — Patient Instructions (Addendum)
Continue prozac at current dose. Use ativan as needed.  Stop at lab on your way out.  Stop at front desk on your way out for referral to urologist.

## 2012-07-15 NOTE — Assessment & Plan Note (Addendum)
Modest improvement but pt does not want to increase medication at this time. Refuses referral for psychologist.  Use ativan prn.  Follow up closely for re-eval in 1 month.

## 2012-07-15 NOTE — Progress Notes (Signed)
  Subjective:    Patient ID: Andrew Farrell, male    DOB: 1955/03/12, 57 y.o.   MRN: [id number]  HPI  57 year old male presents for 1 month follow up of depression and anxiety: He started on prozac 20 mg and ativian prn at last OV with Dr. Patsy Lager. He has recently been unemployed, recent health issues. Today he reports  Some level of improvement from last OV... About 30% better. Depression component is much better. He is sleeping well. If he does not take ativan he gets anxious at 5 in afternoon. No specific panic attack. He is taking ativan daily.  Minimal SE from prozac.  No SI, no HI.  Refuses counsleing at this time.  he wishes to stay on same dose of medication for longer.      Review of Systems  Constitutional: Negative for fever and fatigue.  HENT: Negative for ear pain.   Eyes: Negative for pain.  Respiratory: Negative for shortness of breath.   Cardiovascular: Negative for chest pain, palpitations and leg swelling.  Gastrointestinal: Negative for abdominal pain.       Objective:   Physical Exam  Constitutional: Vital signs are normal. He appears well-developed and well-nourished.  HENT:  Head: Normocephalic.  Right Ear: Hearing normal.  Left Ear: Hearing normal.  Nose: Nose normal.  Mouth/Throat: Oropharynx is clear and moist and mucous membranes are normal.  Neck: Trachea normal. Carotid bruit is not present. No mass and no thyromegaly present.  Cardiovascular: Normal rate, regular rhythm and normal pulses.  Exam reveals no gallop, no distant heart sounds and no friction rub.   No murmur heard. No peripheral edema  Pulmonary/Chest: Effort normal and breath sounds normal. No respiratory distress.  Skin: Skin is warm, dry and intact. No rash noted.  Psychiatric: His speech is normal. Judgment and thought content normal. His mood appears anxious. His affect is angry. He is agitated. Cognition and memory are normal. He expresses no homicidal and no suicidal  ideation. He expresses no suicidal plans and no homicidal plans.          Assessment & Plan:

## 2012-07-18 ENCOUNTER — Other Ambulatory Visit: Payer: Self-pay | Admitting: Family Medicine

## 2012-07-19 ENCOUNTER — Ambulatory Visit: Payer: Self-pay | Admitting: Oncology

## 2012-07-19 NOTE — Telephone Encounter (Signed)
Ok 30, 0 ref 

## 2012-08-13 ENCOUNTER — Ambulatory Visit: Payer: Self-pay | Admitting: Oncology

## 2012-08-17 ENCOUNTER — Ambulatory Visit: Payer: BC Managed Care – PPO | Admitting: Family Medicine

## 2012-08-19 ENCOUNTER — Ambulatory Visit (INDEPENDENT_AMBULATORY_CARE_PROVIDER_SITE_OTHER): Payer: BC Managed Care – PPO | Admitting: Family Medicine

## 2012-08-19 ENCOUNTER — Encounter: Payer: Self-pay | Admitting: *Deleted

## 2012-08-19 ENCOUNTER — Encounter: Payer: Self-pay | Admitting: Family Medicine

## 2012-08-19 ENCOUNTER — Ambulatory Visit: Payer: BC Managed Care – PPO | Admitting: Family Medicine

## 2012-08-19 VITALS — BP 118/70 | HR 90 | Temp 98.4°F | Ht 68.0 in | Wt 161.0 lb

## 2012-08-19 DIAGNOSIS — F3289 Other specified depressive episodes: Secondary | ICD-10-CM

## 2012-08-19 DIAGNOSIS — F411 Generalized anxiety disorder: Secondary | ICD-10-CM

## 2012-08-19 DIAGNOSIS — F329 Major depressive disorder, single episode, unspecified: Secondary | ICD-10-CM

## 2012-08-19 MED ORDER — FLUOXETINE HCL 10 MG PO TABS: 10.0000 mg | ORAL_TABLET | Freq: Every day | ORAL | Status: DC

## 2012-08-19 NOTE — Patient Instructions (Addendum)
Taper off fluoxetine gradually over 2 weeks. Schedule CPX with fasting labs prior in next few months.

## 2012-08-19 NOTE — Progress Notes (Signed)
  Subjective:    Patient ID: Andrew Farrell, male    DOB: 04/28/1955, 57 y.o.   MRN: [id number]  HPI 57 year old male presents for follow up Anxiety/depression:  On fluoxetine 20 mg daily. He feels like he has slight headache constantly, feels med makes him confused, fatigued. He feels like mood is doing well. He has not used ativan often. He feels less anxious now that he has seen new urologist. He wishes to stop medication.  Discussed in detail recent finding of THC on UDS. He denies smoking any marijuana, but states he has been to parties, ? Second hand exposure. He is willing to have UDS repeated today, and would like to to clear this up even though no further controlled substances are needed at this point.  Urologist told him he does not have peyronie's disease. He is very happy, this has made a very big difference for him, not having this diagnosis. Ultrasound was negative. Has follow up on 8/18.    Review of Systems  Constitutional: Negative for fever and fatigue.  HENT: Negative for ear pain.   Eyes: Negative for pain.  Respiratory: Negative for shortness of breath.   Cardiovascular: Negative for chest pain, palpitations and leg swelling.  Gastrointestinal: Negative for abdominal pain.       Objective:   Physical Exam  Constitutional: Vital signs are normal. He appears well-developed and well-nourished.  HENT:  Head: Normocephalic.  Right Ear: Hearing normal.  Left Ear: Hearing normal.  Nose: Nose normal.  Mouth/Throat: Oropharynx is clear and moist and mucous membranes are normal.  Neck: Trachea normal. Carotid bruit is not present. No mass and no thyromegaly present.  Cardiovascular: Normal rate, regular rhythm and normal pulses.  Exam reveals no gallop, no distant heart sounds and no friction rub.   No murmur heard. No peripheral edema  Pulmonary/Chest: Effort normal and breath sounds normal. No respiratory distress.  Skin: Skin is warm, dry and intact. No  rash noted.  Psychiatric: He has a normal mood and affect. His speech is normal. Thought content normal. He is agitated.          Assessment & Plan:

## 2012-08-20 NOTE — Assessment & Plan Note (Signed)
Significant improvement in pt and he wishes to stop all medication given SE..  he refuses counseling and feels he is doing better.

## 2012-08-25 ENCOUNTER — Encounter: Payer: Self-pay | Admitting: Family Medicine

## 2012-08-26 ENCOUNTER — Telehealth: Payer: Self-pay

## 2012-08-26 NOTE — Telephone Encounter (Signed)
Already reported on UDS screen sheet. Was normal. Notify patient of results.

## 2012-08-26 NOTE — Telephone Encounter (Signed)
Pt left v/m requesting results of urine checked last week. Pt request cb at [phone].

## 2012-08-26 NOTE — Telephone Encounter (Signed)
Advised patient urine was normal.

## 2012-08-27 ENCOUNTER — Encounter: Payer: Self-pay | Admitting: Family Medicine

## 2012-08-31 LAB — CANCER CENTER HEMOGLOBIN: HGB: 17.3 g/dL (ref 13.0–18.0)

## 2012-09-13 ENCOUNTER — Ambulatory Visit: Payer: Self-pay | Admitting: Oncology

## 2012-09-17 ENCOUNTER — Telehealth: Payer: Self-pay | Admitting: *Deleted

## 2012-09-17 NOTE — Telephone Encounter (Signed)
Received fax from [facility] Non adherence Therapy Advisory Form.  Placed in Dr. Daphine Deutscher in box for review.

## 2012-11-18 ENCOUNTER — Other Ambulatory Visit: Payer: Self-pay

## 2012-12-06 ENCOUNTER — Other Ambulatory Visit: Payer: Self-pay | Admitting: Family Medicine

## 2013-03-24 ENCOUNTER — Ambulatory Visit: Payer: Self-pay | Admitting: Oncology

## 2013-03-25 LAB — CANCER CENTER HEMOGLOBIN: HGB: 17.2 g/dL (ref 13.0–18.0)

## 2013-04-13 ENCOUNTER — Ambulatory Visit: Payer: Self-pay | Admitting: Oncology

## 2013-06-07 ENCOUNTER — Ambulatory Visit (INDEPENDENT_AMBULATORY_CARE_PROVIDER_SITE_OTHER): Payer: BC Managed Care – PPO | Admitting: Family Medicine

## 2013-06-07 ENCOUNTER — Other Ambulatory Visit: Payer: Self-pay | Admitting: Family Medicine

## 2013-06-07 ENCOUNTER — Encounter: Payer: Self-pay | Admitting: Family Medicine

## 2013-06-07 VITALS — BP 106/62 | HR 92 | Temp 98.5°F | Wt 158.5 lb

## 2013-06-07 DIAGNOSIS — J019 Acute sinusitis, unspecified: Secondary | ICD-10-CM

## 2013-06-07 MED ORDER — AMOXICILLIN-POT CLAVULANATE 875-125 MG PO TABS: 1.0000 | ORAL_TABLET | Freq: Two times a day (BID) | ORAL | Status: DC

## 2013-06-07 NOTE — Patient Instructions (Addendum)
Take a med list whenever you go to the doctor.   Start the augmentin and use the nyquil for the cough.  Take care.

## 2013-06-07 NOTE — Assessment & Plan Note (Signed)
Nontoxic, augmentin, f/u prn.  nyquil for cough.  D/w pt.  Okay for outpatient f/u.

## 2013-06-07 NOTE — Progress Notes (Signed)
Pre visit review using our clinic review tool, if applicable. No additional management support is needed unless otherwise documented below in the visit note.  Recently with URI sx.  Sick for about 1 week.  He was gradually getting a little better at the end of last week, then his sx worsened.  Fever up to 101.  No vomiting.  Cough, dry.  HA.  Diffuse aches.  Eyes were irritated with discharge B.  ST.  Voice is altered.  He feels better today than prev, but he feels worse in the PMs generally.  He's been in bed for the last few days.  Wife has been sick recently.  Taking nyquil for the cough. B maxillary pain.    Meds, vitals, and allergies reviewed.   ROS: See HPI.  Otherwise, noncontributory.  GEN: nad, alert and oriented HEENT: mucous membranes moist, tm w/o erythema, nasal exam w/o erythema, clear discharge noted,  OP with cobblestoning, max sinuses ttp NECK: supple w/o LA CV: rrr.   PULM: ctab, no inc wob EXT: no edema SKIN: no acute rash

## 2013-06-24 ENCOUNTER — Ambulatory Visit: Payer: Self-pay | Admitting: Oncology

## 2013-06-28 ENCOUNTER — Ambulatory Visit (INDEPENDENT_AMBULATORY_CARE_PROVIDER_SITE_OTHER): Payer: BC Managed Care – PPO | Admitting: Family Medicine

## 2013-06-28 ENCOUNTER — Encounter: Payer: Self-pay | Admitting: Family Medicine

## 2013-06-28 VITALS — BP 126/68 | HR 85 | Temp 98.0°F | Wt 165.2 lb

## 2013-06-28 DIAGNOSIS — R7303 Prediabetes: Secondary | ICD-10-CM

## 2013-06-28 DIAGNOSIS — Z0000 Encounter for general adult medical examination without abnormal findings: Secondary | ICD-10-CM

## 2013-06-28 DIAGNOSIS — I159 Secondary hypertension, unspecified: Secondary | ICD-10-CM

## 2013-06-28 DIAGNOSIS — E7800 Pure hypercholesterolemia, unspecified: Secondary | ICD-10-CM

## 2013-06-28 DIAGNOSIS — R7309 Other abnormal glucose: Secondary | ICD-10-CM

## 2013-06-28 LAB — COMPREHENSIVE METABOLIC PANEL
ALBUMIN: 4.2 g/dL (ref 3.5–5.2)
ALT: 35 U/L (ref 0–53)
AST: 25 U/L (ref 0–37)
Alkaline Phosphatase: 52 U/L (ref 39–117)
BILIRUBIN TOTAL: 0.5 mg/dL (ref 0.2–1.2)
BUN: 10 mg/dL (ref 6–23)
CHLORIDE: 97 meq/L (ref 96–112)
CO2: 30 mEq/L (ref 19–32)
Calcium: 9.6 mg/dL (ref 8.4–10.5)
Creatinine, Ser: 1.1 mg/dL (ref 0.4–1.5)
GFR: 71.07 mL/min (ref >60.00)
GLUCOSE: 110 mg/dL — AB (ref 70–99)
Potassium: 4.4 mEq/L (ref 3.5–5.1)
SODIUM: 133 meq/L — AB (ref 135–145)
Total Protein: 7.1 g/dL (ref 6.0–8.3)

## 2013-06-28 LAB — LIPID PANEL
CHOL/HDL RATIO: 3
Cholesterol: 199 mg/dL (ref 0–200)
HDL: 65.5 mg/dL (ref >39.00)
LDL Cholesterol: 115 mg/dL — ABNORMAL HIGH (ref 0–99)
NonHDL: 133.5
Triglycerides: 92 mg/dL (ref 0.0–149.0)
VLDL: 18.4 mg/dL (ref 0.0–40.0)

## 2013-06-28 LAB — HEMOGLOBIN A1C: Hgb A1c MFr Bld: 6.2 % (ref 4.6–6.5)

## 2013-06-28 MED ORDER — FLUTICASONE PROPIONATE 50 MCG/ACT NA SUSP
2.0000 | Freq: Every day | NASAL | Status: DC
Start: 2013-06-28 — End: 2014-06-30

## 2013-06-28 MED ORDER — LISINOPRIL-HYDROCHLOROTHIAZIDE 10-12.5 MG PO TABS: ORAL_TABLET | ORAL | Status: DC

## 2013-06-28 NOTE — Progress Notes (Signed)
Subjective:    Patient ID: Andrew Farrell, male    DOB: 09-17-1955, 58 y.o.   MRN: [id number]  HPI   58 year old male presents for medication follow up.  Hypertension: Well controlled on lisinopril/HCTZ.  BP Readings from Last 3 Encounters:  06/28/13 126/68  06/07/13 106/62  08/19/12 118/70  Using medication without problems or lightheadedness: None  Chest pain with exertion: None  Edema:None  Short of breath:None  Average home BPs: 112/68   Other issues: Has felt great since quit smoking in last 2 years.  ECHO: nml last year, nml EF.   Seeing uro, Dr. Jacqlyn Larsen for testosterone. Plans to change to Dr. Janice Norrie.  PSA and rectal exam in last 6 month.  Alcohol.. Continued use.. 2 beers a day, not drinking as much on weekends as in past.    Anxiety and depression improved.  Elevated Cholesterol: due for re-eval.  Lab Results  Component Value Date   CHOL 187 11/18/2010   HDL 45.90 11/18/2010   LDLCALC 121* 11/18/2010   LDLDIRECT 152.6 08/15/2009   TRIG 103.0 11/18/2010   CHOLHDL 4 11/18/2010  Diet compliance: Moderate, has lost 3 lbs in in last 6 months.  Exercise: walks 2 miles 2 times a week. Wt Readings from Last 3 Encounters:  06/28/13 165 lb 4 oz (74.957 kg)  06/07/13 158 lb 8 oz (71.895 kg)  08/19/12 161 lb (73.029 kg)   Other complaints:   Prediabetes:  Re-eval.   Still some cough oc productive since completed Augmentin for sinus infection 2 weeks a go. No SOB.    Review of Systems  Constitutional: Negative for fatigue.  HENT: Negative for ear pain.   Eyes: Negative for pain.  Respiratory: Negative for shortness of breath.   Cardiovascular: Negative for chest pain.       Objective:   Physical Exam  Constitutional: He appears well-developed and well-nourished.  Non-toxic appearance. He does not appear ill. No distress.  HENT:  Head: Normocephalic and atraumatic.  Right Ear: Hearing, tympanic membrane, external ear and ear canal normal.  Left Ear: Hearing,  tympanic membrane, external ear and ear canal normal.  Nose: Nose normal.  Mouth/Throat: Uvula is midline, oropharynx is clear and moist and mucous membranes are normal.  Eyes: Conjunctivae, EOM and lids are normal. Pupils are equal, round, and reactive to light. Lids are everted and swept, no foreign bodies found.  Neck: Trachea normal, normal range of motion and phonation normal. Neck supple. Carotid bruit is not present. No mass and no thyromegaly present.  Cardiovascular: Normal rate, regular rhythm, S1 normal, S2 normal, intact distal pulses and normal pulses.  Exam reveals no gallop.   No murmur heard. Pulmonary/Chest: Breath sounds normal. He has no wheezes. He has no rhonchi. He has no rales.  Abdominal: Soft. Normal appearance and bowel sounds are normal. There is no hepatosplenomegaly. There is no tenderness. There is no rebound, no guarding and no CVA tenderness. No hernia.  Genitourinary: Penis normal.  Per uro  Lymphadenopathy:    He has no cervical adenopathy.  Neurological: He is alert. He has normal strength and normal reflexes. No cranial nerve deficit or sensory deficit. Gait normal.  Skin: Skin is warm, dry and intact. No rash noted.  Psychiatric: He has a normal mood and affect. His speech is normal and behavior is normal. Judgment normal.          Assessment & Plan:  The patient's preventative maintenance and recommended screening tests for an annual  wellness exam were reviewed in full today. Brought up to date unless services declined.  Counselled on the importance of diet, exercise, and its role in overall health and mortality. The patient's FH and SH was reviewed, including their home life, tobacco status, and drug and alcohol status.   Colon: 2013 PSA and prostate exam at uro  nonsmoker  Limit ETOH.

## 2013-06-28 NOTE — Progress Notes (Signed)
Pre visit review using our clinic review tool, if applicable. No additional management support is needed unless otherwise documented below in the visit note. 

## 2013-06-28 NOTE — Patient Instructions (Addendum)
Stop at lab on way out. Nasal saline spray 3 times a day. Start back nasal steroid spray 2 spray in morning.

## 2013-06-29 ENCOUNTER — Telehealth: Payer: Self-pay | Admitting: Family Medicine

## 2013-06-29 ENCOUNTER — Encounter: Payer: Self-pay | Admitting: *Deleted

## 2013-06-29 NOTE — Telephone Encounter (Signed)
Relevant patient education assigned to patient using Emmi. ° °

## 2013-07-29 ENCOUNTER — Ambulatory Visit: Payer: BC Managed Care – PPO | Admitting: Internal Medicine

## 2013-09-23 ENCOUNTER — Ambulatory Visit: Payer: Self-pay | Admitting: Oncology

## 2013-10-28 ENCOUNTER — Other Ambulatory Visit: Payer: Self-pay

## 2014-06-20 ENCOUNTER — Telehealth: Payer: Self-pay | Admitting: Family Medicine

## 2014-06-20 DIAGNOSIS — R7303 Prediabetes: Secondary | ICD-10-CM

## 2014-06-20 DIAGNOSIS — E7800 Pure hypercholesterolemia, unspecified: Secondary | ICD-10-CM

## 2014-06-20 NOTE — Telephone Encounter (Signed)
-----   Message from Ellamae Sia sent at 06/14/2014  5:59 PM EDT ----- Regarding: Lab orders for Thursday, 6.9.16 Patient is scheduled for CPX labs, please order future labs, Thanks , Karna Christmas

## 2014-06-23 ENCOUNTER — Other Ambulatory Visit (INDEPENDENT_AMBULATORY_CARE_PROVIDER_SITE_OTHER): Payer: 59

## 2014-06-23 DIAGNOSIS — R7309 Other abnormal glucose: Secondary | ICD-10-CM

## 2014-06-23 DIAGNOSIS — E7800 Pure hypercholesterolemia, unspecified: Secondary | ICD-10-CM

## 2014-06-23 DIAGNOSIS — R7303 Prediabetes: Secondary | ICD-10-CM

## 2014-06-23 LAB — COMPREHENSIVE METABOLIC PANEL
ALBUMIN: 4.5 g/dL (ref 3.5–5.2)
ALT: 76 U/L — ABNORMAL HIGH (ref 0–53)
AST: 39 U/L — ABNORMAL HIGH (ref 0–37)
Alkaline Phosphatase: 45 U/L (ref 39–117)
BUN: 14 mg/dL (ref 6–23)
CALCIUM: 10 mg/dL (ref 8.4–10.5)
CO2: 30 meq/L (ref 19–32)
CREATININE: 1.07 mg/dL (ref 0.40–1.50)
Chloride: 98 mEq/L (ref 96–112)
GFR: 75.42 mL/min (ref >60.00)
Glucose, Bld: 123 mg/dL — ABNORMAL HIGH (ref 70–99)
Potassium: 4.5 mEq/L (ref 3.5–5.1)
Sodium: 135 mEq/L (ref 135–145)
Total Bilirubin: 0.8 mg/dL (ref 0.2–1.2)
Total Protein: 7.1 g/dL (ref 6.0–8.3)

## 2014-06-23 LAB — LIPID PANEL
Cholesterol: 209 mg/dL — ABNORMAL HIGH (ref 0–200)
HDL: 56.1 mg/dL (ref >39.00)
LDL CALC: 132 mg/dL — AB (ref 0–99)
NONHDL: 152.9
TRIGLYCERIDES: 105 mg/dL (ref 0.0–149.0)
Total CHOL/HDL Ratio: 4
VLDL: 21 mg/dL (ref 0.0–40.0)

## 2014-06-23 LAB — HEMOGLOBIN A1C: Hgb A1c MFr Bld: 6.1 % (ref 4.6–6.5)

## 2014-06-30 ENCOUNTER — Ambulatory Visit (INDEPENDENT_AMBULATORY_CARE_PROVIDER_SITE_OTHER): Payer: 59 | Admitting: Family Medicine

## 2014-06-30 ENCOUNTER — Encounter: Payer: Self-pay | Admitting: Family Medicine

## 2014-06-30 VITALS — BP 140/80 | HR 78 | Temp 97.6°F | Ht 67.0 in | Wt 172.8 lb

## 2014-06-30 DIAGNOSIS — R740 Nonspecific elevation of levels of transaminase and lactic acid dehydrogenase [LDH]: Secondary | ICD-10-CM | POA: Diagnosis not present

## 2014-06-30 DIAGNOSIS — R7303 Prediabetes: Secondary | ICD-10-CM

## 2014-06-30 DIAGNOSIS — Z0000 Encounter for general adult medical examination without abnormal findings: Secondary | ICD-10-CM

## 2014-06-30 DIAGNOSIS — E7800 Pure hypercholesterolemia, unspecified: Secondary | ICD-10-CM

## 2014-06-30 DIAGNOSIS — R7402 Elevation of levels of lactic acid dehydrogenase (LDH): Secondary | ICD-10-CM

## 2014-06-30 LAB — HEPATIC FUNCTION PANEL
ALBUMIN: 4.5 g/dL (ref 3.5–5.2)
ALK PHOS: 43 U/L (ref 39–117)
ALT: 73 U/L — AB (ref 0–53)
AST: 32 U/L (ref 0–37)
BILIRUBIN DIRECT: 0.1 mg/dL (ref 0.0–0.3)
Total Bilirubin: 0.6 mg/dL (ref 0.2–1.2)
Total Protein: 6.9 g/dL (ref 6.0–8.3)

## 2014-06-30 NOTE — Patient Instructions (Addendum)
Get back to healthy eating , start back walking and work on weight loss.  Work on decreasing alcohol to at least 2 beers a day,bread and cheese ( as well as other animal fats).  Stop at lab on way out as well as at the front desk to set up [place].  Set up colonoscopy this year as planned.

## 2014-06-30 NOTE — Progress Notes (Signed)
Pre visit review using our clinic review tool, if applicable. No additional management support is needed unless otherwise documented below in the visit note. 

## 2014-06-30 NOTE — Progress Notes (Signed)
59 year old male presents for medication follow up.  Hypertension: Borederline control on lisinopril/HCTZ.  BP Readings from Last 3 Encounters:  06/30/14 140/80  06/28/13 126/68  06/07/13 106/62  Using medication without problems or lightheadedness: None  Chest pain with exertion: None  Edema:None  Short of breath:None  Average home BPs: 120/70 Other issues:  ECHO: nml 2014, nml EF.   Seeing uro, Dr.Nesi for testosterone, PSA. Last OV 02/2014 PSA and rectal exam ? Not on paperwiork, but pt says was done.  Alcohol abuse.. Continued use.Marland Kitchen 2-5 beers a day.   Anxiety and depression, well controlled. No issues in last year. Getting back to work helped.  Elevated Cholesterol:  LDL almost at goal < 130 on no medication. Lab Results  Component Value Date   CHOL 209* 06/23/2014   HDL 56.10 06/23/2014   LDLCALC 132* 06/23/2014   LDLDIRECT 152.6 08/15/2009   TRIG 105.0 06/23/2014   CHOLHDL 4 06/23/2014   Diet compliance: Moderate, drives all day. Exercise: None Wt Readings from Last 3 Encounters:  06/30/14 172 lb 12 oz (78.359 kg)  06/28/13 165 lb 4 oz (74.957 kg)  06/07/13 158 lb 8 oz (71.895 kg)   Body mass index is 27.05 kg/(m^2).  Prediabetes:  Stable control.  Glucose 123,  Lab Results  Component Value Date   HGBA1C 6.1 06/23/2014    Elevated liver transaminases:  Likely secondary to alcohol abuse. Improved in past with decreaed ETOH use,but back up now.  Has never has US liver or hepatitis panel.  Social History /Family History/Past Medical History reviewed and updated if needed.  Review of Systems  Constitutional: No concerns for fatigue.  HENT: Negative for ear pain.  Eyes: Negative for pain.  Respiratory: Negative for shortness of breath.  Cardiovascular: Negative for chest pain.       Objective:   Physical Exam  Constitutional: He appears well-developed and well-nourished. Non-toxic appearance. He does not appear ill. No distress.  HENT:   Head: Normocephalic and atraumatic.  Right Ear: Hearing, tympanic membrane, external ear and ear canal normal.  Left Ear: Hearing, tympanic membrane, external ear and ear canal normal.  Nose: Nose normal.  Mouth/Throat: Uvula is midline, oropharynx is clear and moist and mucous membranes are normal.  Eyes: Conjunctivae, EOM and lids are normal. Pupils are equal, round, and reactive to light. Lids are everted and swept, no foreign bodies found.  Neck: Trachea normal, normal range of motion and phonation normal. Neck supple. Carotid bruit is not present. No mass and no thyromegaly present.  Cardiovascular: Normal rate, regular rhythm, S1 normal, S2 normal, intact distal pulses and normal pulses. Exam reveals no gallop.  No murmur heard. Pulmonary/Chest: Breath sounds normal. He has no wheezes. He has no rhonchi. He has no rales.  Abdominal: Soft. Normal appearance and bowel sounds are normal. There is no hepatosplenomegaly. There is no tenderness. There is no rebound, no guarding and no CVA tenderness. No hernia.  Genitourinary:   Per uro  Lymphadenopathy:   He has no cervical adenopathy.  Neurological: He is alert. He has normal strength and normal reflexes. No cranial nerve deficit or sensory deficit. Gait normal.  Skin: Skin is warm, dry and intact. No rash noted.  Psychiatric: He has a normal mood and affect. His speech is normal and behavior is normal. Judgment normal.          Assessment & Plan:  The patient's preventative maintenance and recommended screening tests for an annual wellness exam were reviewed in  full today. Brought up to date unless services declined.  Counselled on the importance of diet, exercise, and its role in overall health and mortality. The patient's FH and SH was reviewed, including their home life, tobacco status, and drug and alcohol status.   Colon: 2013 Dr. Gustavo Lah [facility], several sessile polyps: tubular adenoma repeat recommended 3 years. PSA  and prostate exam at uro Nonsmoker Limit ETOH. Recommend hep C screen.

## 2014-07-01 LAB — HEPATITIS PANEL, ACUTE
HCV AB: NEGATIVE
HEP A IGM: NONREACTIVE
HEP B C IGM: NONREACTIVE
HEP B S AG: NEGATIVE

## 2014-07-05 ENCOUNTER — Ambulatory Visit: Payer: 59

## 2014-07-25 ENCOUNTER — Other Ambulatory Visit: Payer: Self-pay | Admitting: Family Medicine

## 2014-08-07 ENCOUNTER — Ambulatory Visit
Admission: RE | Admit: 2014-08-07 | Discharge: 2014-08-07 | Disposition: A | Discharge status: Home / Self Care | Payer: 59 | Source: Ambulatory Visit | Attending: Family Medicine | Admitting: Family Medicine

## 2014-08-07 DIAGNOSIS — R740 Nonspecific elevation of levels of transaminase and lactic acid dehydrogenase [LDH]: Secondary | ICD-10-CM | POA: Diagnosis present

## 2014-08-07 DIAGNOSIS — R7402 Elevation of levels of lactic acid dehydrogenase (LDH): Secondary | ICD-10-CM

## 2014-08-07 DIAGNOSIS — K760 Fatty (change of) liver, not elsewhere classified: Secondary | ICD-10-CM | POA: Diagnosis not present

## 2015-03-06 LAB — HM DIABETES EYE EXAM

## 2015-03-20 ENCOUNTER — Encounter: Payer: Self-pay | Admitting: Family Medicine

## 2015-03-22 ENCOUNTER — Encounter: Payer: Self-pay | Admitting: *Deleted

## 2015-03-23 ENCOUNTER — Encounter: Payer: Self-pay | Admitting: *Deleted

## 2015-03-23 ENCOUNTER — Ambulatory Visit: Payer: 59 | Admitting: Anesthesiology

## 2015-03-23 ENCOUNTER — Ambulatory Visit
Admission: RE | Admit: 2015-03-23 | Discharge: 2015-03-23 | Disposition: A | Discharge status: Home / Self Care | Payer: 59 | Source: Ambulatory Visit | Attending: Gastroenterology | Admitting: Gastroenterology

## 2015-03-23 ENCOUNTER — Encounter
Admission: RE | Disposition: A | Discharge status: Home / Self Care | Payer: Self-pay | Source: Ambulatory Visit | Attending: Gastroenterology

## 2015-03-23 DIAGNOSIS — E785 Hyperlipidemia, unspecified: Secondary | ICD-10-CM | POA: Insufficient documentation

## 2015-03-23 DIAGNOSIS — F329 Major depressive disorder, single episode, unspecified: Secondary | ICD-10-CM | POA: Insufficient documentation

## 2015-03-23 DIAGNOSIS — Z800 Family history of malignant neoplasm of digestive organs: Secondary | ICD-10-CM | POA: Diagnosis not present

## 2015-03-23 DIAGNOSIS — Z86010 Personal history of colonic polyps: Secondary | ICD-10-CM | POA: Insufficient documentation

## 2015-03-23 DIAGNOSIS — Z888 Allergy status to other drugs, medicaments and biological substances status: Secondary | ICD-10-CM | POA: Insufficient documentation

## 2015-03-23 DIAGNOSIS — D123 Benign neoplasm of transverse colon: Secondary | ICD-10-CM | POA: Diagnosis not present

## 2015-03-23 DIAGNOSIS — D751 Secondary polycythemia: Secondary | ICD-10-CM | POA: Insufficient documentation

## 2015-03-23 DIAGNOSIS — K640 First degree hemorrhoids: Secondary | ICD-10-CM | POA: Insufficient documentation

## 2015-03-23 DIAGNOSIS — Z79899 Other long term (current) drug therapy: Secondary | ICD-10-CM | POA: Diagnosis not present

## 2015-03-23 DIAGNOSIS — D125 Benign neoplasm of sigmoid colon: Secondary | ICD-10-CM | POA: Diagnosis not present

## 2015-03-23 DIAGNOSIS — K760 Fatty (change of) liver, not elsewhere classified: Secondary | ICD-10-CM | POA: Insufficient documentation

## 2015-03-23 DIAGNOSIS — F419 Anxiety disorder, unspecified: Secondary | ICD-10-CM | POA: Diagnosis not present

## 2015-03-23 DIAGNOSIS — Z1211 Encounter for screening for malignant neoplasm of colon: Secondary | ICD-10-CM | POA: Diagnosis not present

## 2015-03-23 DIAGNOSIS — D124 Benign neoplasm of descending colon: Secondary | ICD-10-CM | POA: Diagnosis not present

## 2015-03-23 DIAGNOSIS — K621 Rectal polyp: Secondary | ICD-10-CM | POA: Diagnosis not present

## 2015-03-23 DIAGNOSIS — I10 Essential (primary) hypertension: Secondary | ICD-10-CM | POA: Diagnosis not present

## 2015-03-23 DIAGNOSIS — K5730 Diverticulosis of large intestine without perforation or abscess without bleeding: Secondary | ICD-10-CM | POA: Diagnosis not present

## 2015-03-23 DIAGNOSIS — F1010 Alcohol abuse, uncomplicated: Secondary | ICD-10-CM | POA: Insufficient documentation

## 2015-03-23 HISTORY — DX: Secondary polycythemia: D75.1

## 2015-03-23 HISTORY — DX: Testicular hypofunction: E29.1

## 2015-03-23 HISTORY — DX: Depression, unspecified: F32.A

## 2015-03-23 HISTORY — PX: COLONOSCOPY WITH PROPOFOL: SHX5780

## 2015-03-23 HISTORY — DX: Fatty (change of) liver, not elsewhere classified: K76.0

## 2015-03-23 HISTORY — DX: Essential (primary) hypertension: I10

## 2015-03-23 HISTORY — DX: Major depressive disorder, single episode, unspecified: F32.9

## 2015-03-23 HISTORY — DX: Alcohol abuse, uncomplicated: F10.10

## 2015-03-23 HISTORY — DX: Hyperlipidemia, unspecified: E78.5

## 2015-03-23 HISTORY — DX: Benign prostatic hyperplasia without lower urinary tract symptoms: N40.0

## 2015-03-23 LAB — CBC
HCT: 50.1 % (ref 40.0–52.0)
Hemoglobin: 17.2 g/dL (ref 13.0–18.0)
MCH: 31.7 pg (ref 26.0–34.0)
MCHC: 34.3 g/dL (ref 32.0–36.0)
MCV: 92.6 fL (ref 80.0–100.0)
Platelets: 235 10*3/uL (ref 150–440)
RBC: 5.41 MIL/uL (ref 4.40–5.90)
RDW: 14.2 % (ref 11.5–14.5)
WBC: 7.5 10*3/uL (ref 3.8–10.6)

## 2015-03-23 LAB — PROTIME-INR
INR: 1.05
Prothrombin Time: 13.9 seconds (ref 11.4–15.0)

## 2015-03-23 SURGERY — COLONOSCOPY WITH PROPOFOL
Anesthesia: General

## 2015-03-23 MED ORDER — FENTANYL CITRATE (PF) 100 MCG/2ML IJ SOLN
INTRAMUSCULAR | Status: DC | PRN
  Administered 2015-03-23 (×2): 50 ug via INTRAVENOUS

## 2015-03-23 MED ORDER — SODIUM CHLORIDE 0.9 % IV SOLN
INTRAVENOUS | Status: DC
  Administered 2015-03-23 (×3): via INTRAVENOUS

## 2015-03-23 MED ORDER — PROPOFOL 10 MG/ML IV BOLUS
INTRAVENOUS | Status: DC | PRN
  Administered 2015-03-23: 20 mg via INTRAVENOUS
  Administered 2015-03-23: 30 mg via INTRAVENOUS

## 2015-03-23 MED ORDER — MIDAZOLAM HCL 2 MG/2ML IJ SOLN
INTRAMUSCULAR | Status: DC | PRN
  Administered 2015-03-23 (×2): 1 mg via INTRAVENOUS

## 2015-03-23 MED ORDER — PROPOFOL 500 MG/50ML IV EMUL
INTRAVENOUS | Status: DC | PRN
  Administered 2015-03-23: 80 ug/kg/min via INTRAVENOUS

## 2015-03-23 MED ORDER — SODIUM CHLORIDE 0.9 % IV SOLN
INTRAVENOUS | Status: DC
Start: 2015-03-23 — End: 2015-03-23

## 2015-03-23 NOTE — Anesthesia Preprocedure Evaluation (Signed)
Anesthesia Evaluation  Patient identified by MRN, date of birth, ID band Patient awake    Reviewed: Allergy & Precautions, H&P , NPO status , Patient's Chart, lab work & pertinent test results  History of Anesthesia Complications Negative for: history of anesthetic complications  Airway Mallampati: III  TM Distance: >3 FB Neck ROM: limited    Dental  (+) Poor Dentition, Chipped   Pulmonary neg shortness of breath, former smoker,    Pulmonary exam normal breath sounds clear to auscultation       Cardiovascular Exercise Tolerance: Good hypertension, (-) angina(-) Past MI and (-) DOE Normal cardiovascular exam Rhythm:regular Rate:Normal     Neuro/Psych PSYCHIATRIC DISORDERS Anxiety Depression negative neurological ROS     GI/Hepatic negative GI ROS, Neg liver ROS,   Endo/Other  negative endocrine ROS  Renal/GU negative Renal ROS  negative genitourinary   Musculoskeletal   Abdominal   Peds  Hematology negative hematology ROS (+)   Anesthesia Other Findings Past Medical History:   Anxiety                                                      Alcohol abuse                                                Depression                                                   BPH (benign prostatic hyperplasia)                           Fatty liver                                                  Hypertension                                                 Hyperlipidemia                                               Hypogonadism in male                                         Polycythemia                                                History reviewed. No pertinent surgical history.  BMI    Body Mass Index   25.85 kg/m 2    Signs and symptoms suggestive of sleep apnea    Reproductive/Obstetrics negative OB ROS                             Anesthesia Physical Anesthesia Plan  ASA: III  Anesthesia  Plan: General   Post-op Pain Management:    Induction:   Airway Management Planned:   Additional Equipment:   Intra-op Plan:   Post-operative Plan:   Informed Consent: I have reviewed the patients History and Physical, chart, labs and discussed the procedure including the risks, benefits and alternatives for the proposed anesthesia with the patient or authorized representative who has indicated his/her understanding and acceptance.   Dental Advisory Given  Plan Discussed with: Anesthesiologist, CRNA and Surgeon  Anesthesia Plan Comments:         Anesthesia Quick Evaluation

## 2015-03-23 NOTE — Transfer of Care (Signed)
Immediate Anesthesia Transfer of Care Note  Patient: Andrew Farrell  Procedure(s) Performed: Procedure(s): COLONOSCOPY WITH PROPOFOL (N/A)  Patient Location: PACU  Anesthesia Type:General  Level of Consciousness: sedated  Airway & Oxygen Therapy: Patient Spontanous Breathing and Patient connected to nasal cannula oxygen  Post-op Assessment: Report given to RN and Post -op Vital signs reviewed and stable  Post vital signs: Reviewed and stable  Last Vitals:  Filed Vitals:   03/23/15 1300 03/23/15 1521  BP: 135/70 101/73  Pulse: 84 85  Temp: 36.2 C 35.9 C  Resp: 20 11    Complications: No apparent anesthesia complications

## 2015-03-23 NOTE — Op Note (Signed)
[facility] Gastroenterology Patient Name: Andrew Farrell Procedure Date: 03/23/2015 2:27 PM MRN: [id number] Account #: [id number] Date of Birth: Oct 16, 1955 Admit Type: Outpatient Age: 60 Room: [facility] ENDO ROOM 3 Gender: Male Note Status: Finalized Procedure:            Colonoscopy Indications:          Family history of colon cancer in a first-degree                        relative, Personal history of colonic polyps Providers:            Lollie Sails, MD Referring MD:         Jinny Sanders, MD (Referring MD) Medicines:            Monitored Anesthesia Care Complications:        No immediate complications. Procedure:            Pre-Anesthesia Assessment:                       - ASA Grade Assessment: III - A patient with severe                        systemic disease.                       After obtaining informed consent, the colonoscope was                        passed under direct vision. Throughout the procedure,                        the patient's blood pressure, pulse, and oxygen                        saturations were monitored continuously. The                        Colonoscope was introduced through the anus and                        advanced to the the cecum, identified by appendiceal                        orifice and ileocecal valve. The colonoscopy was                        performed with moderate difficulty due to multiple                        diverticula in the colon, poor bowel prep and a                        tortuous colon. Successful completion of the procedure                        was aided by lavage. Findings:      Multiple small and large-mouthed diverticula were found in the sigmoid       colon, descending colon and transverse colon.      A 10 mm polyp was found in the proximal transverse colon. The polyp  was       pedunculated. The polyp was removed with a hot snare. Resection and       retrieval were complete.      A 3  mm polyp was found in the hepatic flexure. The polyp was sessile.       The polyp was removed with a cold biopsy forceps. Resection and       retrieval were complete.      A 5 mm polyp was found in the distal descending colon. The polyp was       sessile. The polyp was removed with a cold snare. Resection and       retrieval were complete.      A 2 mm polyp was found in the rectum. The polyp was sessile. The polyp       was removed with a cold biopsy forceps. Resection and retrieval were       complete.      A 3 mm polyp was found in the proximal sigmoid colon. The polyp was       sessile. The polyp was removed with a cold biopsy forceps. Resection and       retrieval were complete.      Non-bleeding internal hemorrhoids were found during anoscopy. The       hemorrhoids were small and Grade I (internal hemorrhoids that do not       prolapse).      No additional abnormalities were found on retroflexion. Impression:           - Diverticulosis in the sigmoid colon, in the                        descending colon and in the transverse colon.                       - One 9 mm polyp in the proximal transverse colon,                        removed with a hot snare. Resected and retrieved.                       - One 3 mm polyp at the hepatic flexure, removed with a                        cold biopsy forceps. Resected and retrieved.                       - One 5 mm polyp in the distal descending colon,                        removed with a cold snare. Resected and retrieved.                       - One 2 mm polyp in the rectum, removed with a cold                        biopsy forceps. Resected and retrieved.                       - One 3 mm polyp in the proximal sigmoid colon, removed  with a cold biopsy forceps. Resected and retrieved.                       - Non-bleeding internal hemorrhoids. Recommendation:       - Await pathology results.                       -  Telephone GI clinic for pathology results in 1 week. Procedure Code(s):    --- Professional ---                       [phone], Colonoscopy, flexible; with removal of tumor(s),                        polyp(s), or other lesion(s) by snare technique                       [id number], 74, Colonoscopy, flexible; with biopsy, single                        or multiple Diagnosis Code(s):    --- Professional ---                       K64.0, First degree hemorrhoids                       D12.3, Benign neoplasm of transverse colon (hepatic                        flexure or splenic flexure)                       D12.4, Benign neoplasm of descending colon                       K62.1, Rectal polyp                       D12.5, Benign neoplasm of sigmoid colon                       Z80.0, Family history of malignant neoplasm of                        digestive organs                       Z86.010, Personal history of colonic polyps                       K57.30, Diverticulosis of large intestine without                        perforation or abscess without bleeding CPT copyright 2016 American Medical Association. All rights reserved. The codes documented in this report are preliminary and upon coder review may  be revised to meet current compliance requirements. Lollie Sails, MD 03/23/2015 3:22:02 PM This report has been signed electronically. Number of Addenda: 0 Note Initiated On: 03/23/2015 2:27 PM Scope Withdrawal Time: 0 hours 21 minutes 39 seconds  Total Procedure Duration: 0 hours 37 minutes 1 second       [facility]

## 2015-03-23 NOTE — Anesthesia Postprocedure Evaluation (Signed)
Anesthesia Post Note  Patient: Andrew Farrell  Procedure(s) Performed: Procedure(s) (LRB): COLONOSCOPY WITH PROPOFOL (N/A)  Patient location during evaluation: PACU Anesthesia Type: General Level of consciousness: awake Pain management: pain level controlled Vital Signs Assessment: post-procedure vital signs reviewed and stable Respiratory status: spontaneous breathing Cardiovascular status: blood pressure returned to baseline Anesthetic complications: no    Last Vitals:  Filed Vitals:   03/23/15 1541 03/23/15 1551  BP: 122/79 126/75  Pulse: 67 66  Temp:    Resp: 13 15    Last Pain: There were no vitals filed for this visit.               [name],Andrew Khader

## 2015-03-23 NOTE — H&P (Signed)
Outpatient short stay form Pre-procedure 03/23/2015 2:17 PM Lollie Sails MD  Primary Physician: Dr. Eliezer Lofts  Reason for visit:  Colonoscopy  History of present illness:  Patient is a 60 year old male presenting today for colonoscopy in regards to his personal history of adenomatous colon polyps and family history of colon cancer in primary relative. He takes no aspirin or blood thinning products. He tolerated his prep well. Due to his history of steatohepatosis as he is checking ProTime and platelet count today.    Current facility-administered medications:  .  0.9 %  sodium chloride infusion, , Intravenous, Continuous, Lollie Sails, MD, Last Rate: 20 mL/hr at 03/23/15 1318 .  0.9 %  sodium chloride infusion, , Intravenous, Continuous, Lollie Sails, MD  Prescriptions prior to admission  Medication Sig Dispense Refill Last Dose  . lisinopril-hydrochlorothiazide (PRINZIDE,ZESTORETIC) 10-12.5 MG per tablet TAKE ONE TABLET BY MOUTH ONCE DAILY 90 tablet 3 03/23/2015 at 1030  . pentoxifylline (TRENTAL) 400 MG CR tablet Take 400 mg by mouth 3 (three) times daily with meals.   03/22/2015 at Unknown time  . sildenafil (REVATIO) 20 MG tablet Take 20 mg by mouth 3 (three) times daily.   Past Month at Unknown time  . testosterone (ANDROGEL) 50 MG/5GM (1%) GEL Place 5 g onto the skin daily.     Marland Kitchen testosterone cypionate (DEPOTESTOTERONE CYPIONATE) 200 MG/ML injection Inject into the muscle every 28 (twenty-eight) days. Reported on 03/23/2015   Past Week at Unknown time     Allergies  Allergen Reactions  . Bupropion Hcl     REACTION: unspecified  . Buspirone Hcl     REACTION: severe ha  . Escitalopram Oxalate     REACTION: made negative     Past Medical History  Diagnosis Date  . Anxiety   . Alcohol abuse   . Depression   . BPH (benign prostatic hyperplasia)   . Fatty liver   . Hypertension   . Hyperlipidemia   . Hypogonadism in male   . Polycythemia     Review of  systems:      Physical Exam    Heart and lungs: Regular rate and rhythm without rub or gallop, lungs are bilaterally clear.    HEENT: Normocephalic atraumatic eyes are anicteric    Other:     Pertinant exam for procedure: Protuberant, soft, nontender nondistended bowel sounds positive normoactive.    Planned proceedures: Anoscopy and indicated procedures. I have discussed the risks benefits and complications of procedures to include not limited to bleeding, infection, perforation and the risk of sedation and the patient wishes to proceed.    Lollie Sails, MD Gastroenterology 03/23/2015  2:17 PM

## 2015-03-24 ENCOUNTER — Encounter: Payer: Self-pay | Admitting: Gastroenterology

## 2015-03-27 LAB — SURGICAL PATHOLOGY

## 2015-07-06 ENCOUNTER — Telehealth: Payer: Self-pay | Admitting: Family Medicine

## 2015-07-06 ENCOUNTER — Other Ambulatory Visit: Payer: 59

## 2015-07-06 DIAGNOSIS — R7303 Prediabetes: Secondary | ICD-10-CM

## 2015-07-06 DIAGNOSIS — Z125 Encounter for screening for malignant neoplasm of prostate: Secondary | ICD-10-CM

## 2015-07-06 DIAGNOSIS — E7800 Pure hypercholesterolemia, unspecified: Secondary | ICD-10-CM

## 2015-07-06 NOTE — Telephone Encounter (Signed)
-----   Message from Marchia Bond sent at 06/26/2015  1:24 PM EDT ----- Regarding: Cpx labs Fri 6/23, need orders. Thanks! :-) Please order  future cpx labs for pt's upcoming lab appt. Thanks Aniceto Boss

## 2015-07-13 ENCOUNTER — Encounter: Payer: Self-pay | Admitting: Family Medicine

## 2015-07-13 ENCOUNTER — Ambulatory Visit (INDEPENDENT_AMBULATORY_CARE_PROVIDER_SITE_OTHER): Payer: 59 | Admitting: Family Medicine

## 2015-07-13 ENCOUNTER — Encounter: Payer: Self-pay | Admitting: *Deleted

## 2015-07-13 VITALS — BP 110/60 | HR 78 | Temp 97.7°F | Ht 67.0 in | Wt 174.5 lb

## 2015-07-13 DIAGNOSIS — Z125 Encounter for screening for malignant neoplasm of prostate: Secondary | ICD-10-CM | POA: Diagnosis not present

## 2015-07-13 DIAGNOSIS — Z0000 Encounter for general adult medical examination without abnormal findings: Secondary | ICD-10-CM | POA: Diagnosis not present

## 2015-07-13 DIAGNOSIS — R7303 Prediabetes: Secondary | ICD-10-CM

## 2015-07-13 DIAGNOSIS — E7800 Pure hypercholesterolemia, unspecified: Secondary | ICD-10-CM

## 2015-07-13 LAB — COMPREHENSIVE METABOLIC PANEL
ALBUMIN: 4.6 g/dL (ref 3.5–5.2)
ALT: 64 U/L — ABNORMAL HIGH (ref 0–53)
AST: 30 U/L (ref 0–37)
Alkaline Phosphatase: 43 U/L (ref 39–117)
BUN: 15 mg/dL (ref 6–23)
CALCIUM: 10.5 mg/dL (ref 8.4–10.5)
CHLORIDE: 100 meq/L (ref 96–112)
CO2: 34 meq/L — AB (ref 19–32)
CREATININE: 0.9 mg/dL (ref 0.40–1.50)
GFR: 91.76 mL/min (ref >60.00)
Glucose, Bld: 118 mg/dL — ABNORMAL HIGH (ref 70–99)
Potassium: 4.2 mEq/L (ref 3.5–5.1)
Sodium: 141 mEq/L (ref 135–145)
Total Bilirubin: 0.7 mg/dL (ref 0.2–1.2)
Total Protein: 7.1 g/dL (ref 6.0–8.3)

## 2015-07-13 LAB — LIPID PANEL
CHOLESTEROL: 263 mg/dL — AB (ref 0–200)
HDL: 58.1 mg/dL (ref >39.00)
LDL CALC: 176 mg/dL — AB (ref 0–99)
NonHDL: 204.86
TRIGLYCERIDES: 143 mg/dL (ref 0.0–149.0)
Total CHOL/HDL Ratio: 5
VLDL: 28.6 mg/dL (ref 0.0–40.0)

## 2015-07-13 LAB — PSA: PSA: 0.61 ng/mL (ref 0.10–4.00)

## 2015-07-13 LAB — HEMOGLOBIN A1C: HEMOGLOBIN A1C: 6.3 % (ref 4.6–6.5)

## 2015-07-13 NOTE — Patient Instructions (Addendum)
Stop at lab on way out.  Cut back on alcohol to 2 a day!  Work on [employer], weight loss and increasing exercise.

## 2015-07-13 NOTE — Progress Notes (Signed)
Pre visit review using our clinic review tool, if applicable. No additional management support is needed unless otherwise documented below in the visit note. 

## 2015-07-13 NOTE — Progress Notes (Signed)
60 year old male presents for physical.  Hypertension: good control on lisinopril/HCTZ.  BP Readings from Last 3 Encounters:  07/13/15 110/60  03/23/15 126/75  06/30/14 140/80  Using medication without problems or lightheadedness: None  Chest pain with exertion: None  Edema:None  Short of breath:None  Average home BPs: 120/70 Other issues:  ECHO: nml 2014, nml EF.   Seeing uro, Dr.Nesi for testosterone, PSA.   Alcohol abuse.. Continued use.Marland Kitchen 2-5 beers a day.   Anxiety and depression, well controlled. No issues in last year. Getting back to work helped.  Elevated Cholesterol: Due for re-eval. Goal < 130. Lab Results  Component Value Date   CHOL 209* 06/23/2014   HDL 56.10 06/23/2014   LDLCALC 132* 06/23/2014   LDLDIRECT 152.6 08/15/2009   TRIG 105.0 06/23/2014   CHOLHDL 4 06/23/2014  Diet compliance: Moderate, drives all day. Exercise: None Wt Readings from Last 3 Encounters:  07/13/15 174 lb 8 oz (79.153 kg)  03/23/15 170 lb (77.111 kg)  06/30/14 172 lb 12 oz (78.359 kg)   Prediabetes: Due for re-eval.    Elevated liver transaminases: Due for re-eval. Likely secondary to alcohol abuse. Improved in past with decreaed ETOH use,but back up now. Has never has US liver or hepatitis panel.  Wt Readings from Last 3 Encounters:  07/13/15 174 lb 8 oz (79.153 kg)  03/23/15 170 lb (77.111 kg)  06/30/14 172 lb 12 oz (78.359 kg)    Social History /Family History/Past Medical History reviewed and updated if needed.  Review of Systems  Constitutional: No concerns for fatigue.  HENT: Negative for ear pain.  Eyes: Negative for pain.  Respiratory: Negative for shortness of breath.  Cardiovascular: Negative for chest pain.      Objective:  Physical Exam  Constitutional: He appears well-developed and well-nourished. Non-toxic appearance. He does not appear ill. No distress.  HENT:  Head: Normocephalic and atraumatic.  Right Ear: Hearing, tympanic  membrane, external ear and ear canal normal.  Left Ear: Hearing, tympanic membrane, external ear and ear canal normal.  Nose: Nose normal.  Mouth/Throat: Uvula is midline, oropharynx is clear and moist and mucous membranes are normal.  Eyes: Conjunctivae, EOM and lids are normal. Pupils are equal, round, and reactive to light. Lids are everted and swept, no foreign bodies found.  Neck: Trachea normal, normal range of motion and phonation normal. Neck supple. Carotid bruit is not present. No mass and no thyromegaly present.  Cardiovascular: Normal rate, regular rhythm, S1 normal, S2 normal, intact distal pulses and normal pulses. Exam reveals no gallop.  No murmur heard. Pulmonary/Chest: Breath sounds normal. He has no wheezes. He has no rhonchi. He has no rales.  Abdominal: Soft. Normal appearance and bowel sounds are normal. There is no hepatosplenomegaly. There is no tenderness. There is no rebound, no guarding and no CVA tenderness. No hernia.  Genitourinary:  Per uro  Lymphadenopathy:   He has no cervical adenopathy.  Neurological: He is alert. He has normal strength and normal reflexes. No cranial nerve deficit or sensory deficit. Gait normal.  Skin: Skin is warm, dry and intact. No rash noted.  Psychiatric: He has a normal mood and affect. His speech is normal and behavior is normal. Judgment normal.         Assessment & Plan:  The patient's preventative maintenance and recommended screening tests for an annual wellness exam were reviewed in full today. Brought up to date unless services declined.  Counselled on the importance of diet, exercise, and  its role in overall health and mortality. The patient's FH and SH was reviewed, including their home life, tobacco status, and drug and alcohol status.   Colon: 2017 Dr. Gustavo Lah [facility], several sessile polyps:  recommended 3 years. PSA and prostate exam at uro Former smoker Quit 7 years ago. Asymptomatic. Limit  ETOH.  Hep C screen. Neg  refused HIV screen Uptodate

## 2015-07-13 NOTE — Addendum Note (Signed)
Addended by: Ellamae Sia on: 07/13/2015 11:09 AM   Modules accepted: Orders, SmartSet

## 2015-07-20 ENCOUNTER — Other Ambulatory Visit: Payer: Self-pay | Admitting: Family Medicine

## 2015-08-20 ENCOUNTER — Ambulatory Visit (INDEPENDENT_AMBULATORY_CARE_PROVIDER_SITE_OTHER): Payer: 59 | Admitting: Internal Medicine

## 2015-08-20 ENCOUNTER — Encounter: Payer: Self-pay | Admitting: Internal Medicine

## 2015-08-20 VITALS — BP 130/86 | HR 87 | Temp 97.7°F | Wt 174.0 lb

## 2015-08-20 DIAGNOSIS — J209 Acute bronchitis, unspecified: Secondary | ICD-10-CM | POA: Diagnosis not present

## 2015-08-20 MED ORDER — AZITHROMYCIN 250 MG PO TABS: ORAL_TABLET | ORAL | 0 refills | Status: DC

## 2015-08-20 MED ORDER — HYDROCODONE-HOMATROPINE 5-1.5 MG/5ML PO SYRP: 5.0000 mL | ORAL_SOLUTION | Freq: Three times a day (TID) | ORAL | 0 refills | Status: DC | PRN

## 2015-08-20 NOTE — Progress Notes (Signed)
Subjective:    Patient ID: Andrew Farrell, male    DOB: 05-13-1955, 60 y.o.   MRN: [id number]  HPI  Pt presents to the clinic today with a complaint of cough x 2 weeks.  He reports the cough has been nonproductive, and admits to chest congestion and shortness of breath that has caused him to sleep in a recliner x 1 week.  He admits to sore throat and chills when symptoms began 2 weeks ago, but denies these symptoms today.  He reports sneezing, stuffy nose, sinus pressure, drainage down the back of the throat, and nasal discharge that began as clear and is now yellow and green in color.  He denies fever, watery eyes, redness of the eyes, ear pain, ear fullness, ear pressure, or chest pain.  He has tried Mucinex and Nyquil with some relief.  He reports his wife and son had the same symptoms that started on the same day, but their symptoms have since resolved while his have persisted.  He denies any other sick contacts.  He reports a history of allergies, and denies a history of asthma.      Review of Systems   Past Medical History:  Diagnosis Date  . Alcohol abuse   . Anxiety   . BPH (benign prostatic hyperplasia)   . Depression   . Fatty liver   . Hyperlipidemia   . Hypertension   . Hypogonadism in male   . Polycythemia     Current Outpatient Prescriptions  Medication Sig Dispense Refill  . lisinopril-hydrochlorothiazide (PRINZIDE,ZESTORETIC) 10-12.5 MG tablet TAKE ONE TABLET BY MOUTH ONCE DAILY 90 tablet 3  . sildenafil (REVATIO) 20 MG tablet Take 20 mg by mouth 3 (three) times daily.    Marland Kitchen testosterone (ANDROGEL) 50 MG/5GM (1%) GEL Place 5 g onto the skin daily.    Marland Kitchen azithromycin (ZITHROMAX) 250 MG tablet Take 2 tabs today, then 1 tab daily x 4 days 6 tablet 0  . HYDROcodone-homatropine (HYCODAN) 5-1.5 MG/5ML syrup Take 5 mLs by mouth every 8 (eight) hours as needed for cough. 120 mL 0   No current facility-administered medications for this visit.     Allergies  Allergen  Reactions  . Buspirone Hcl     REACTION: severe ha  . Escitalopram Oxalate     REACTION: made negative    History reviewed. No pertinent family history.  Social History   Social History  . Marital status: Married    Spouse name: N/A  . Number of children: N/A  . Years of education: N/A   Occupational History  . Not on file.   Social History Main Topics  . Smoking status: Former Smoker    Quit date: 03/22/2009  . Smokeless tobacco: Never Used  . Alcohol use 6.0 oz/week    10 Cans of beer per week  . Drug use: No  . Sexual activity: Not on file   Other Topics Concern  . Not on file   Social History Narrative   Married, Press photographer.      Const: Pt reports chills.  Denies fever. HEENT: Pt reports sneezing, stuffy nose, drainage down the back of the throat, and sore throat.  Denies watery eyes, redness of the eyes, ear pressure, ear fullness, and ear pain.   Pulm: Pt reports cough, chest congestion, and shortness of breath.  Denies sputum production.   CV: Denies chest pain.  No other specific complaints in a complete review of systems (except as listed in HPI above).  Objective:   Physical Exam  BP 130/86   Pulse 87   Temp 97.7 F (36.5 C) (Oral)   Wt 174 lb (78.9 kg)   SpO2 97%   BMI 27.25 kg/m   General: Ill-appearing, in no acute distress. Neck: Lymphadenopathy present in anterior cervical chain. HEENT: Slight conjunctival injection, no scleral icterus noted.  Cerumen present bilateraly ears, TMs not visible.  Slight discharge present in nose, no erythema noted.  No erythema or exudates of pharynx.  Sinuses nontender to palpation. Pulm: Scattered rhonchi and expiratory wheezes bilaterally on auscultation. CV: Regular rate and rhythm.  No murmurs, rubs, or gallops.      Assessment & Plan:   Acute Bronchitis:  eRx for Azithromycin 250 mg 2 tablets today, then 1 tablet daily x 4 days RX for Hycodan 5 ml q8hrs as needed for cough Call if symptoms worsen  or do not resolve  RTC as needed or if symptoms persist or worsen Carnell Casamento, NP

## 2015-08-20 NOTE — Patient Instructions (Signed)

## 2015-08-27 ENCOUNTER — Telehealth: Payer: Self-pay | Admitting: Family Medicine

## 2015-08-27 NOTE — Telephone Encounter (Signed)
He needs to be reevaluated. He may need a chest xray.

## 2015-08-27 NOTE — Telephone Encounter (Signed)
Pt left message on triage phone states he has completed azithromycin (ZITHROMAX) 250 MG tablet and cough syrup and "nothing has cleared up.  Pt is requesting an rx for something else/  Best number to call is [phone]

## 2015-08-29 NOTE — Telephone Encounter (Signed)
Appointment 8/17

## 2015-08-30 ENCOUNTER — Encounter: Payer: Self-pay | Admitting: Internal Medicine

## 2015-08-30 ENCOUNTER — Ambulatory Visit (INDEPENDENT_AMBULATORY_CARE_PROVIDER_SITE_OTHER): Payer: 59 | Admitting: Internal Medicine

## 2015-08-30 VITALS — BP 124/80 | HR 85 | Temp 97.9°F | Wt 174.0 lb

## 2015-08-30 DIAGNOSIS — J209 Acute bronchitis, unspecified: Secondary | ICD-10-CM

## 2015-08-30 MED ORDER — LEVOFLOXACIN 500 MG PO TABS: 500.0000 mg | ORAL_TABLET | Freq: Every day | ORAL | 0 refills | Status: DC

## 2015-08-30 MED ORDER — PREDNISONE 10 MG PO TABS: ORAL_TABLET | ORAL | 0 refills | Status: DC

## 2015-08-30 NOTE — Patient Instructions (Signed)

## 2015-08-30 NOTE — Progress Notes (Signed)
HPI  Pt presents to the clinic today to follow up cough. He was seen 08/20/15, diagnosed with acute bronchitis. He was treated with Azithromax and Hycodan but reports he has not had any improvement. He reports a cough and chest congestion that started 3 weeks ago. The cough is non productive, and seems worse at night. He has had chills and sweats but has not taken his fever. He has tried Mucinex and Dayquil with minimimal relief. He does not have a history of allergies or asthma. He has not had sick contacts.  Review of Systems      Past Medical History:  Diagnosis Date  . Alcohol abuse   . Anxiety   . BPH (benign prostatic hyperplasia)   . Depression   . Fatty liver   . Hyperlipidemia   . Hypertension   . Hypogonadism in male   . Polycythemia     No family history on file.  Social History   Social History  . Marital status: Married    Spouse name: N/A  . Number of children: N/A  . Years of education: N/A   Occupational History  . Not on file.   Social History Main Topics  . Smoking status: Former Smoker    Quit date: 03/22/2009  . Smokeless tobacco: Never Used  . Alcohol use 6.0 oz/week    10 Cans of beer per week  . Drug use: No  . Sexual activity: Not on file   Other Topics Concern  . Not on file   Social History Narrative   Married, Press photographer.     Allergies  Allergen Reactions  . Buspirone Hcl     REACTION: severe ha  . Escitalopram Oxalate     REACTION: made negative     Constitutional: Positive fatigue and fever. Denies abrupt weight changes.  HEENT:  Positive runny nose, sore throat. Denies eye redness, eye pain, pressure behind the eyes, facial pain, nasal congestion, ear pain, ringing in the ears, wax buildup, or bloody nose. Respiratory: Positive cough. Denies difficulty breathing or shortness of breath.  Cardiovascular: Denies chest pain, chest tightness, palpitations or swelling in the hands or feet.   No other specific complaints in a complete  review of systems (except as listed in HPI above).  Objective:   There were no vitals taken for this visit. Wt Readings from Last 3 Encounters:  08/20/15 174 lb (78.9 kg)  07/13/15 174 lb 8 oz (79.2 kg)  03/23/15 170 lb (77.1 kg)     General: Appears his stated age,  in NAD. HEENT: Head: normal shape and size, no sinus tenderness noted; Eyes: sclera white, no icterus, conjunctiva pink; Ears: Tm's gray and intact, normal light reflex; Throat/Mouth:  Teeth present, mucosa erythematous and moist, no exudate noted, no lesions or ulcerations noted.  Neck: No cervical lymphadenopathy.  Cardiovascular: Normal rate and rhythm. S1,S2 noted.  No murmur, rubs or gallops noted.  Pulmonary/Chest: Normal effort and scattered rhonchi throughout. No respiratory distress. No wheezes, rales or ronchi noted.      Assessment & Plan:   Acute Bronchitis, unresolved:  Get some rest and drink plenty of water eRx for Levaquin 500 mg x 7 days eRx for Prednisone Taper x 6 days He declines Albuterol inhaler at this time Work note provied  RTC as needed or if symptoms persist.   Webb Silversmith, NP

## 2015-10-18 ENCOUNTER — Telehealth: Payer: Self-pay | Admitting: Family Medicine

## 2015-10-18 DIAGNOSIS — E7800 Pure hypercholesterolemia, unspecified: Secondary | ICD-10-CM

## 2015-10-18 DIAGNOSIS — R740 Nonspecific elevation of levels of transaminase and lactic acid dehydrogenase [LDH]: Secondary | ICD-10-CM

## 2015-10-18 DIAGNOSIS — R7401 Elevation of levels of liver transaminase levels: Secondary | ICD-10-CM

## 2015-10-18 NOTE — Telephone Encounter (Signed)
-----   Message from Ellamae Sia sent at 10/12/2015  4:45 PM EDT ----- Regarding: Lab orders for Friday, 10.6.17 Lab orders no f/u

## 2015-10-19 ENCOUNTER — Other Ambulatory Visit (INDEPENDENT_AMBULATORY_CARE_PROVIDER_SITE_OTHER): Payer: 59

## 2015-10-19 DIAGNOSIS — R7402 Elevation of levels of lactic acid dehydrogenase (LDH): Secondary | ICD-10-CM

## 2015-10-19 DIAGNOSIS — R740 Nonspecific elevation of levels of transaminase and lactic acid dehydrogenase [LDH]: Secondary | ICD-10-CM

## 2015-10-19 DIAGNOSIS — E7800 Pure hypercholesterolemia, unspecified: Secondary | ICD-10-CM | POA: Diagnosis not present

## 2015-10-19 LAB — COMPREHENSIVE METABOLIC PANEL
ALBUMIN: 4.3 g/dL (ref 3.5–5.2)
ALK PHOS: 41 U/L (ref 39–117)
ALT: 62 U/L — ABNORMAL HIGH (ref 0–53)
AST: 25 U/L (ref 0–37)
BUN: 16 mg/dL (ref 6–23)
CHLORIDE: 102 meq/L (ref 96–112)
CO2: 30 mEq/L (ref 19–32)
Calcium: 10.2 mg/dL (ref 8.4–10.5)
Creatinine, Ser: 0.98 mg/dL (ref 0.40–1.50)
GFR: 83.09 mL/min (ref >60.00)
Glucose, Bld: 132 mg/dL — ABNORMAL HIGH (ref 70–99)
POTASSIUM: 4.4 meq/L (ref 3.5–5.1)
SODIUM: 141 meq/L (ref 135–145)
TOTAL PROTEIN: 7.1 g/dL (ref 6.0–8.3)
Total Bilirubin: 0.5 mg/dL (ref 0.2–1.2)

## 2015-10-19 LAB — LIPID PANEL
CHOLESTEROL: 236 mg/dL — AB (ref 0–200)
HDL: 58.4 mg/dL (ref >39.00)
LDL CALC: 153 mg/dL — AB (ref 0–99)
NonHDL: 177.37
TRIGLYCERIDES: 122 mg/dL (ref 0.0–149.0)
Total CHOL/HDL Ratio: 4
VLDL: 24.4 mg/dL (ref 0.0–40.0)

## 2015-10-25 ENCOUNTER — Telehealth: Payer: Self-pay | Admitting: Family Medicine

## 2015-10-25 NOTE — Telephone Encounter (Signed)
Called regarding labs, he does not have an upcoming office visit scheduled.  cb number is [phone] Thanks

## 2015-10-26 NOTE — Telephone Encounter (Signed)
Mr. Cost notified as instructed by telephone.  Appointment scheduled for 10/30/2015 at 9:15 am.

## 2015-10-26 NOTE — Telephone Encounter (Signed)
Labs suggest new diagnosis of diabetes..  .. Also cholesterol far above goal  ( now LDL goal < 100) .Marland Kitchen Needs to start cholesterol med.. Make appt to discuss

## 2015-10-30 ENCOUNTER — Ambulatory Visit (INDEPENDENT_AMBULATORY_CARE_PROVIDER_SITE_OTHER): Payer: 59 | Admitting: Family Medicine

## 2015-10-30 ENCOUNTER — Encounter: Payer: Self-pay | Admitting: Family Medicine

## 2015-10-30 VITALS — BP 114/60 | HR 82 | Temp 98.3°F | Ht 67.0 in | Wt 175.2 lb

## 2015-10-30 DIAGNOSIS — E7800 Pure hypercholesterolemia, unspecified: Secondary | ICD-10-CM

## 2015-10-30 DIAGNOSIS — M79674 Pain in right toe(s): Secondary | ICD-10-CM | POA: Insufficient documentation

## 2015-10-30 DIAGNOSIS — Z23 Encounter for immunization: Secondary | ICD-10-CM | POA: Diagnosis not present

## 2015-10-30 DIAGNOSIS — E119 Type 2 diabetes mellitus without complications: Secondary | ICD-10-CM | POA: Insufficient documentation

## 2015-10-30 DIAGNOSIS — I10 Essential (primary) hypertension: Secondary | ICD-10-CM | POA: Diagnosis not present

## 2015-10-30 LAB — HM DIABETES FOOT EXAM

## 2015-10-30 LAB — URIC ACID: Uric Acid, Serum: 7.5 mg/dL (ref 4.0–7.8)

## 2015-10-30 LAB — HEMOGLOBIN A1C: Hgb A1c MFr Bld: 6.1 % (ref 4.6–6.5)

## 2015-10-30 NOTE — Patient Instructions (Addendum)
Stop at lab on way out for a1C check. He plans on becoming more active. Increase exercise as able.  Work on low [employer].  Use can use aleve as need for toe and joint pain.  Decrease beef.. Eat lean chicken, [place] fish. Avoid cheese, butter, cream.

## 2015-10-30 NOTE — Assessment & Plan Note (Signed)
?   Secondary to bunion versus gout.  Eval with uric acid.

## 2015-10-30 NOTE — Assessment & Plan Note (Signed)
Poor control but improving. 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 If not at goal at that time consider a medication.

## 2015-10-30 NOTE — Progress Notes (Signed)
   Subjective:    Patient ID: Andrew Farrell, male    DOB: 1955/12/18, 60 y.o.   MRN: [id number]  HPI   59 year old male presents for follow up appt after recent labs showed new diagnosis of DM.  Hypertension: good control on lisinopril/HCTZ.  BP Readings from Last 3 Encounters:  10/30/15 114/60  08/30/15 124/80  08/20/15 130/86  Using medication without problems or lightheadedness: None  Chest pain with exertion: None  Edema:None  Short of breath:None  Average home BPs: 120/70 Other issues:  ECHO: nml 2014, nml EF.   Alcohol abuse.. Continued use.Marland Kitchen 2-5 beers a day.   Diabetes:  New diagnosis.  Glucose 132.  He is on the road a lot.. Minimal exercise. Using medications without difficulties: Hypoglycemic episodes: Hyperglycemic episodes: Feet problems: Stubbed right great toe 2 years ago... In last few weeks he has been having pain in right 1st MCP joint. He has been walking more. No heat, mild redness, bunion on lateral MCP joints. Blood Sugars averaging: eye exam within last year:    Elevated Cholesterol: Far from new goal LDL < 100. Lab Results  Component Value Date   CHOL 236 (H) 10/19/2015   HDL 58.40 10/19/2015   LDLCALC 153 (H) 10/19/2015   LDLDIRECT 152.6 08/15/2009   TRIG 122.0 10/19/2015   CHOLHDL 4 10/19/2015   Diet compliance: Moderate, drives all day. Exercise: None  Elevated liver transaminases: remains elevated.  Likely secondary to alcohol abuse. Improved in past with decreaed ETOH use,but back up now. Has never has US liver or hepatitis panel.     Review of Systems  Constitutional: Negative for fatigue and fever.  HENT: Negative for ear pain.   Eyes: Negative for pain.  Respiratory: Negative for cough and shortness of breath.   Cardiovascular: Negative for chest pain, palpitations and leg swelling.  Gastrointestinal: Negative for abdominal pain.  Genitourinary: Negative for dysuria.  Musculoskeletal: Negative for  arthralgias.  Neurological: Negative for syncope, light-headedness and headaches.  Psychiatric/Behavioral: Negative for dysphoric mood.       Objective:   Physical Exam  Constitutional: Vital signs are normal. He appears well-developed and well-nourished.  HENT:  Head: Normocephalic.  Right Ear: Hearing normal.  Left Ear: Hearing normal.  Nose: Nose normal.  Mouth/Throat: Oropharynx is clear and moist and mucous membranes are normal.  Neck: Trachea normal. Carotid bruit is not present. No thyroid mass and no thyromegaly present.  Cardiovascular: Normal rate, regular rhythm and normal pulses.  Exam reveals no gallop, no distant heart sounds and no friction rub.   No murmur heard. No peripheral edema  Pulmonary/Chest: Effort normal and breath sounds normal. No respiratory distress.  Musculoskeletal:       Right foot: There is tenderness, bony tenderness and deformity.   Right foot: bunion at 1s t MCP, slight redness, no increased warmth, tender when MCP squeezed  Skin: Skin is warm, dry and intact. No rash noted.  Psychiatric: He has a normal mood and affect. His speech is normal and behavior is normal. Thought content normal.      Diabetic foot exam: Normal inspection except ttp in right MCP joint, no redness, no heat, has bunion. No skin breakdown No calluses  Normal DP pulses Normal sensation to light touch and monofilament Nails thickened and browned.     Assessment & Plan:

## 2015-10-30 NOTE — Assessment & Plan Note (Signed)
Prediabetes progressed to diabetes diagnosis per glucose > 126 . Eval a1c to eval conttrol. Info on low carb diet as well as complications of DM reviewed.

## 2015-10-30 NOTE — Progress Notes (Signed)
Pre visit review using our clinic review tool, if applicable. No additional management support is needed unless otherwise documented below in the visit note. 

## 2015-10-30 NOTE — Assessment & Plan Note (Signed)
Well controlled. Continue current medication.  

## 2015-10-31 ENCOUNTER — Encounter: Payer: Self-pay | Admitting: *Deleted

## 2016-02-01 ENCOUNTER — Ambulatory Visit: Payer: 59 | Admitting: Family Medicine

## 2016-02-04 ENCOUNTER — Emergency Department
Admission: EM | Admit: 2016-02-04 | Discharge: 2016-02-04 | Disposition: A | Discharge status: Home / Self Care | Payer: 59 | Attending: Emergency Medicine | Admitting: Emergency Medicine

## 2016-02-04 ENCOUNTER — Encounter: Payer: Self-pay | Admitting: Emergency Medicine

## 2016-02-04 ENCOUNTER — Telehealth: Payer: Self-pay | Admitting: Family Medicine

## 2016-02-04 DIAGNOSIS — I10 Essential (primary) hypertension: Secondary | ICD-10-CM | POA: Insufficient documentation

## 2016-02-04 DIAGNOSIS — Z79899 Other long term (current) drug therapy: Secondary | ICD-10-CM | POA: Diagnosis not present

## 2016-02-04 DIAGNOSIS — R55 Syncope and collapse: Secondary | ICD-10-CM | POA: Insufficient documentation

## 2016-02-04 DIAGNOSIS — Z87891 Personal history of nicotine dependence: Secondary | ICD-10-CM | POA: Diagnosis not present

## 2016-02-04 DIAGNOSIS — Z5321 Procedure and treatment not carried out due to patient leaving prior to being seen by health care provider: Secondary | ICD-10-CM | POA: Diagnosis not present

## 2016-02-04 DIAGNOSIS — R42 Dizziness and giddiness: Secondary | ICD-10-CM | POA: Insufficient documentation

## 2016-02-04 DIAGNOSIS — R110 Nausea: Secondary | ICD-10-CM | POA: Diagnosis not present

## 2016-02-04 LAB — BASIC METABOLIC PANEL
ANION GAP: 11 (ref 5–15)
BUN: 11 mg/dL (ref 6–20)
CO2: 27 mmol/L (ref 22–32)
CREATININE: 1.14 mg/dL (ref 0.61–1.24)
Calcium: 9.7 mg/dL (ref 8.9–10.3)
Chloride: 96 mmol/L — ABNORMAL LOW (ref 101–111)
GFR calc non Af Amer: >60 mL/min (ref >60)
Glucose, Bld: 137 mg/dL — ABNORMAL HIGH (ref 65–99)
POTASSIUM: 4.2 mmol/L (ref 3.5–5.1)
SODIUM: 134 mmol/L — AB (ref 135–145)

## 2016-02-04 LAB — CBC
HEMATOCRIT: 54.7 % — AB (ref 40.0–52.0)
HEMOGLOBIN: 19 g/dL — AB (ref 13.0–18.0)
MCH: 32.4 pg (ref 26.0–34.0)
MCHC: 34.8 g/dL (ref 32.0–36.0)
MCV: 93.3 fL (ref 80.0–100.0)
PLATELETS: 239 10*3/uL (ref 150–440)
RBC: 5.86 MIL/uL (ref 4.40–5.90)
RDW: 14.4 % (ref 11.5–14.5)
WBC: 11.7 10*3/uL — AB (ref 3.8–10.6)

## 2016-02-04 LAB — ETHANOL: Alcohol, Ethyl (B): <5 mg/dL (ref <5)

## 2016-02-04 LAB — TROPONIN I: Troponin I: <0.03 ng/mL (ref <0.03)

## 2016-02-04 NOTE — Telephone Encounter (Signed)
[facility] called -  Pt refued ed outcome with symptoms of passing out on Saturday, dizziness and hot flashes TH will send over report

## 2016-02-04 NOTE — ED Notes (Signed)
No answer when called several times from lobby 

## 2016-02-04 NOTE — Telephone Encounter (Signed)
Patient Name: Andrew Farrell  DOB: Mar 27, 1955    Initial Comment caller states he is having hot flashes and he fainted   Nurse Assessment  Nurse: Leilani Merl, RN, Nira Conn Date/Time ([place] Time): 02/04/2016 12:39:11 PM  Confirm and document reason for call. If symptomatic, describe symptoms. ---Caller states that he has been having hot flashes in the last couple of weeks. He is on testosterone and they changed it from a gel to an injection. On Saturday he walked over to the window and passed out, he was only out for a couple of seconds. He does not feel lightheaded now.  Does the patient have any new or worsening symptoms? ---Yes  Will a triage be completed? ---Yes  Related visit to physician within the last 2 weeks? ---No  Does the PT have any chronic conditions? (i.e. diabetes, asthma, etc.) ---Yes  List chronic conditions. ---See MR  Is this a behavioral health or substance abuse call? ---No     Guidelines    Guideline Title Affirmed Question Affirmed Notes  Fainting [1] Age > 50 years AND [2] now alert and feels fine    Final Disposition User   Go to ED Now (or PCP triage) Stephens, RN, [name] states that he is not able to get a ride to the ED until his wife comes home from work this evening around 7 or 8. Will call office and give information.   Referrals  GO TO FACILITY REFUSED   Disagree/Comply: Disagree  Disagree/Comply Reason: Disagree with instructions

## 2016-02-04 NOTE — ED Triage Notes (Signed)
Reports syncopal episode on Saturday. Today patient had episode of dizziness and nausea.

## 2016-02-04 NOTE — Telephone Encounter (Signed)
I spoke with pt; pt is resting in recliner and feels OK except feet are very cold. Pt said he is going to ED when wife gets home; does not want EMTS called. If pt feels worse before wife gets home; he will call 911. FYI to Dr Diona Browner.

## 2016-02-05 NOTE — Telephone Encounter (Signed)
Noted. Call pt for update.

## 2016-02-05 NOTE — Telephone Encounter (Signed)
Andrew Farrell was seen in ED yesterday.

## 2016-02-06 ENCOUNTER — Ambulatory Visit: Payer: 59 | Admitting: Family Medicine

## 2016-02-06 NOTE — Telephone Encounter (Signed)
Andrew Farrell is scheduled to see Dr. Diona Browner on Thursday 02/07/16 at 11:45 am.

## 2016-02-06 NOTE — Telephone Encounter (Signed)
Benjamine Mola From [facility] ER called to inform Dr Diona Browner that pt had labs but left before seeing Dr.

## 2016-02-06 NOTE — Telephone Encounter (Signed)
Noted. Have pt make follow up appt to be seen if still having an issue

## 2016-02-07 ENCOUNTER — Ambulatory Visit (INDEPENDENT_AMBULATORY_CARE_PROVIDER_SITE_OTHER): Payer: 59 | Admitting: Family Medicine

## 2016-02-07 ENCOUNTER — Encounter: Payer: Self-pay | Admitting: Family Medicine

## 2016-02-07 ENCOUNTER — Telehealth: Payer: Self-pay | Admitting: Radiology

## 2016-02-07 VITALS — BP 120/70 | HR 105 | Temp 97.7°F | Ht 67.0 in | Wt 179.5 lb

## 2016-02-07 DIAGNOSIS — D751 Secondary polycythemia: Secondary | ICD-10-CM

## 2016-02-07 DIAGNOSIS — R55 Syncope and collapse: Secondary | ICD-10-CM | POA: Diagnosis not present

## 2016-02-07 LAB — CBC WITH DIFFERENTIAL/PLATELET
Basophils Absolute: 0 10*3/uL (ref 0.0–0.1)
Basophils Relative: 0.3 % (ref 0.0–3.0)
EOS PCT: 0.6 % (ref 0.0–5.0)
Eosinophils Absolute: 0.1 10*3/uL (ref 0.0–0.7)
HCT: 55.6 % — ABNORMAL HIGH (ref 39.0–52.0)
Hemoglobin: 19.1 g/dL (ref 13.0–17.0)
Lymphocytes Relative: 14.6 % (ref 12.0–46.0)
Lymphs Abs: 1.3 10*3/uL (ref 0.7–4.0)
MCHC: 34.4 g/dL (ref 30.0–36.0)
MCV: 93.2 fl (ref 78.0–100.0)
MONO ABS: 0.8 10*3/uL (ref 0.1–1.0)
MONOS PCT: 9 % (ref 3.0–12.0)
Neutro Abs: 6.8 10*3/uL (ref 1.4–7.7)
Neutrophils Relative %: 75.5 % (ref 43.0–77.0)
Platelets: 241 10*3/uL (ref 150.0–400.0)
RBC: 5.97 Mil/uL — AB (ref 4.22–5.81)
RDW: 14.3 % (ref 11.5–15.5)
WBC: 9.1 10*3/uL (ref 4.0–10.5)

## 2016-02-07 LAB — TESTOSTERONE: Testosterone: 375 ng/dL (ref 300.00–890.00)

## 2016-02-07 NOTE — Telephone Encounter (Signed)
[facility] called critical lab results, HGB 19.1, HCT 55.6. Results given to Dr Diona Browner

## 2016-02-07 NOTE — Patient Instructions (Signed)
Stop at lab on way out. 

## 2016-02-07 NOTE — Progress Notes (Signed)
Pre visit review using our clinic review tool, if applicable. No additional management support is needed unless otherwise documented below in the visit note. 

## 2016-02-07 NOTE — Assessment & Plan Note (Signed)
Repeat cbc to re-eval hg and wbc. No clear sign of infection.  EKG unremarkable.. Given sudden onset.. If work up neg consider further eval of heart or referral to cardiology.

## 2016-02-07 NOTE — Assessment & Plan Note (Signed)
Re-eval to verify not lab error. No clear respiratory cause Likely due to testosterone treatment. If true measurement.. Needs to stop testosterone given clotting risk .  if not improving off testosterone refer to heme for further eval.

## 2016-02-07 NOTE — Progress Notes (Signed)
   Subjective:    Patient ID: Andrew Farrell, male    DOB: [birth year], 61 y.o.   MRN: [id number]  HPI  61 year old male presents for ER follow up.   He was seen at [facility] on 1/22 for dizziness. He had labs drawn but left before assessment.  BMET showed slightly low NA at 134, CBG 137 Cbc showed wbc 11.7  Hg high at 19  ETOH and troponin I nml.   Today he reports he started having sudden onset syncope. He had eaten earlier that day, no skipped meals. Was sitting then stood and went to window and passed out.  LOC, but did not hit head. No chest pain, no palpitations. No SOB. No proceeding dizziness.  Has been feeling fine  Except for hot flashes in last month,  Last 4-5 min.  Chills.  Wt Readings from Last 3 Encounters:  02/07/16 179 lb 8 oz (81.4 kg)  02/04/16 176 lb (79.8 kg)  10/30/15 175 lb 4 oz (79.5 kg)    No new med. He has cut back on beer and carbs. Trying to lose weight.  Only change was change of gel testosterone to injection per URO. Testosterone last check 260.   BP Readings from Last 3 Encounters:  02/07/16 120/70  02/04/16 125/77  10/30/15 114/60    Since ER visit he has been doing okay.. No further dizziness, no further syncope.  Still with hot flashes.   Review of Systems  Constitutional: Positive for chills, diaphoresis and fatigue.  HENT: Negative for ear pain.   Eyes: Negative for pain.  Respiratory: Negative for cough and shortness of breath.   Cardiovascular: Negative for chest pain, palpitations and leg swelling.  Gastrointestinal: Negative for abdominal distention.  Genitourinary: Negative for dysuria.  Neurological: Positive for headaches.         Objective:   Physical Exam  Constitutional: Vital signs are normal. He appears well-developed and well-nourished.  Obese, sweaty appearing  HENT:  Head: Normocephalic.  Right Ear: Hearing normal.  Left Ear: Hearing normal.  Nose: Nose normal.  Mouth/Throat: Oropharynx is clear and  moist and mucous membranes are normal.  Neck: Trachea normal. Carotid bruit is not present. No thyroid mass and no thyromegaly present.  Cardiovascular: Normal rate, regular rhythm and normal pulses.  Exam reveals no gallop, no distant heart sounds and no friction rub.   No murmur heard. No peripheral edema  Pulmonary/Chest: Effort normal and breath sounds normal. No respiratory distress.  Skin: Skin is warm, dry and intact. No rash noted.  Psychiatric: He has a normal mood and affect. His speech is normal and behavior is normal. Thought content normal.          Assessment & Plan:

## 2016-02-07 NOTE — Telephone Encounter (Signed)
Will await full cbc with diff.  Stable from ER visit.

## 2016-02-12 NOTE — Telephone Encounter (Signed)
See Dr Diona Browner comment

## 2016-03-21 IMAGING — US US ABDOMEN LIMITED
1 series · 14 of 25 positions shown · non-contrast
Comparison: None.

CLINICAL DATA: Elevated liver function studies

EXAM:
US ABDOMEN LIMITED - RIGHT UPPER QUADRANT

[Series 1: us abdomen limited · 0.23mm/px · 14 of 49 slices shown]
[im 1/49]
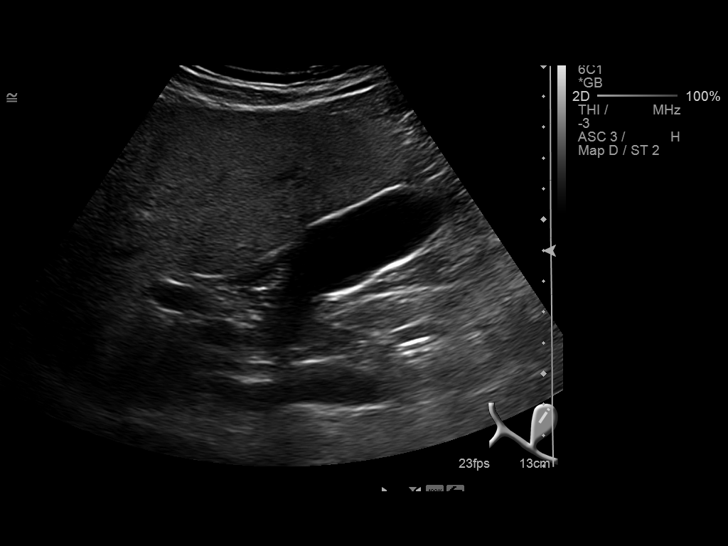
[im 5/49]
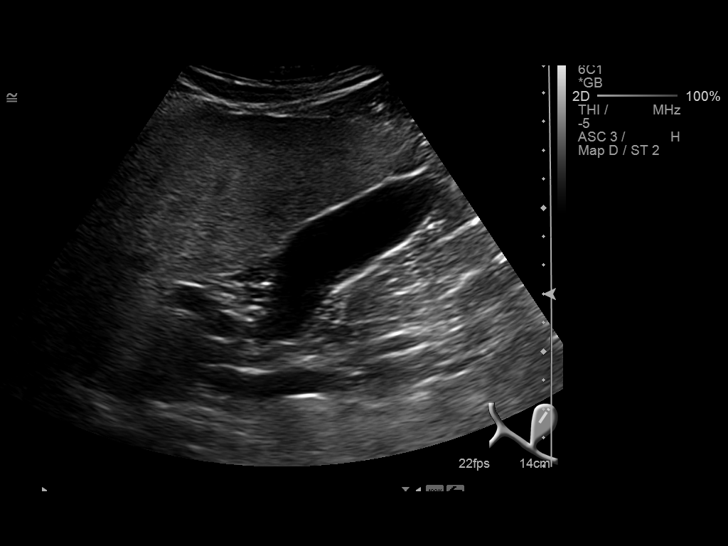
[im 9/49]
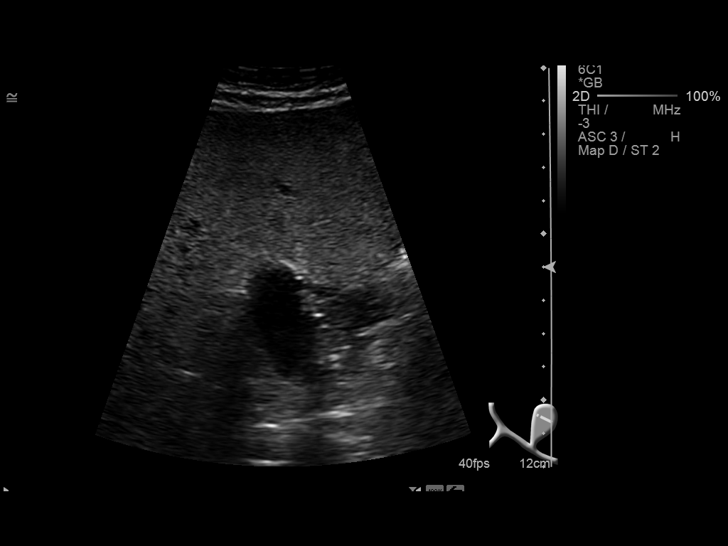
[im 13/49]
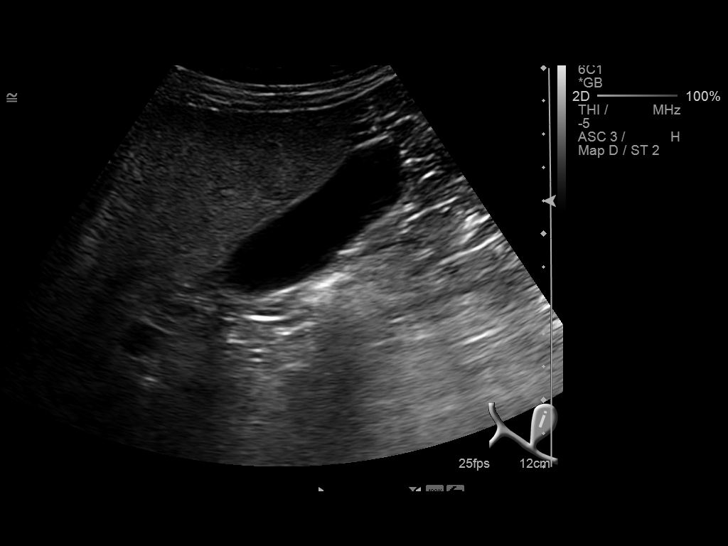
[im 17/49]
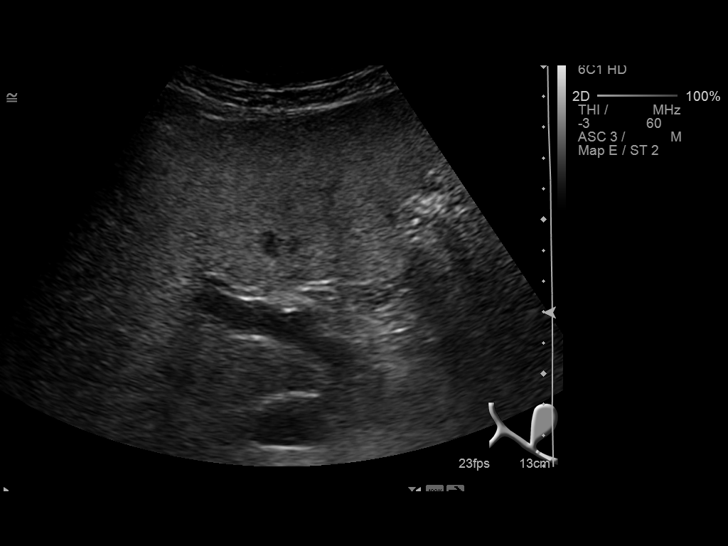
[im 19/49]
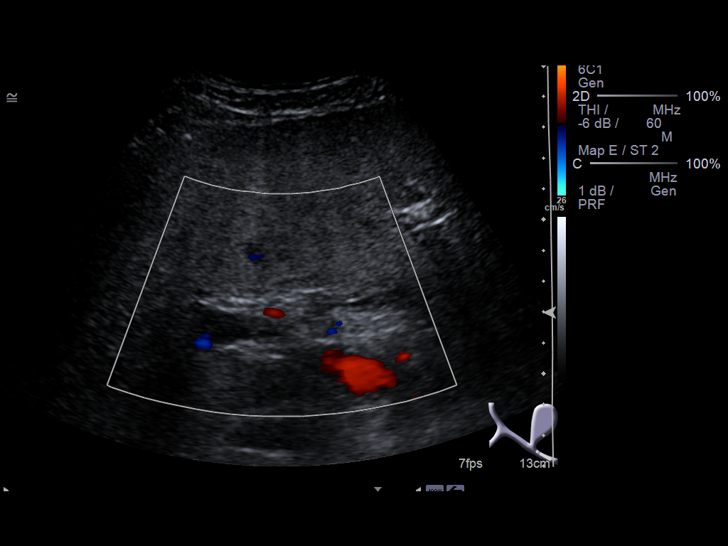
[im 23/49]
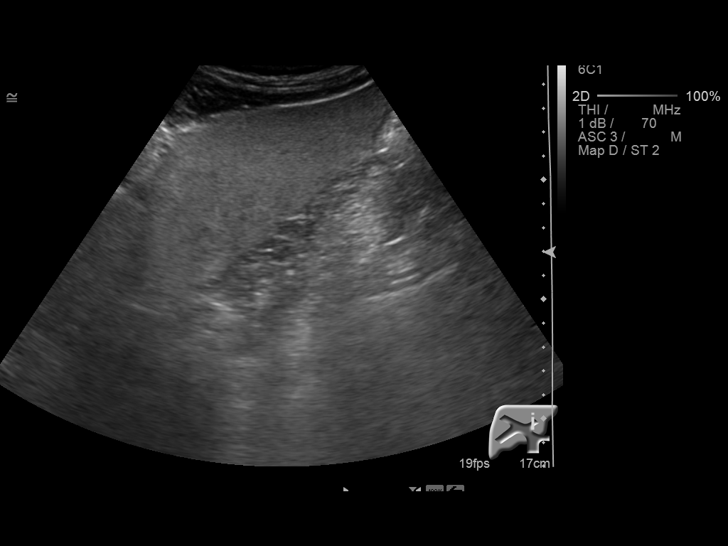
[im 27/49]
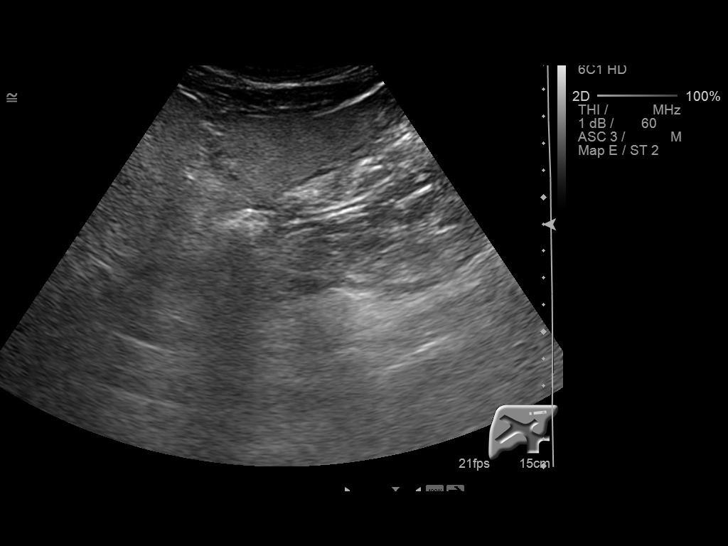
[im 31/49]
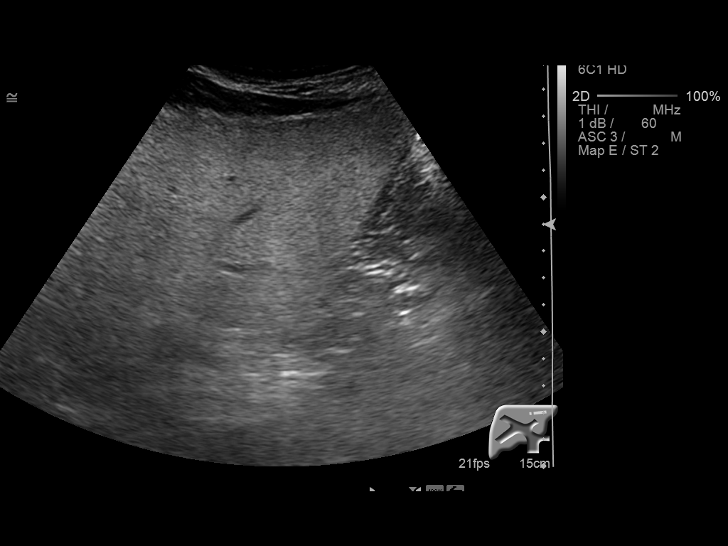
[im 33/49]
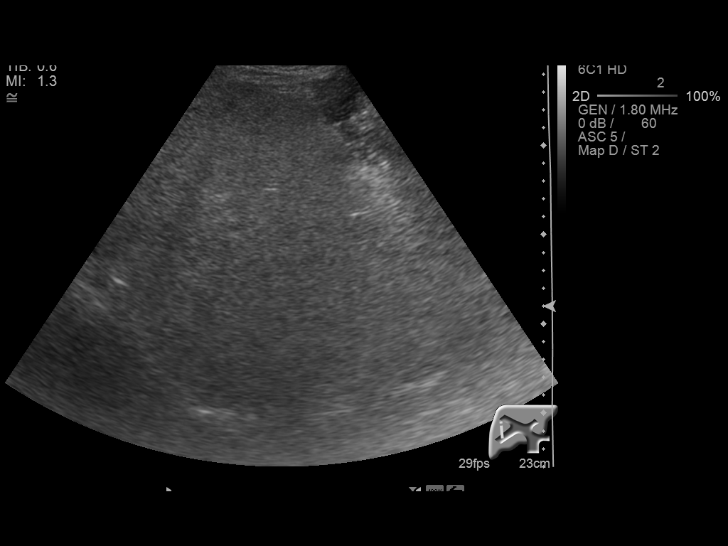
[im 37/49]
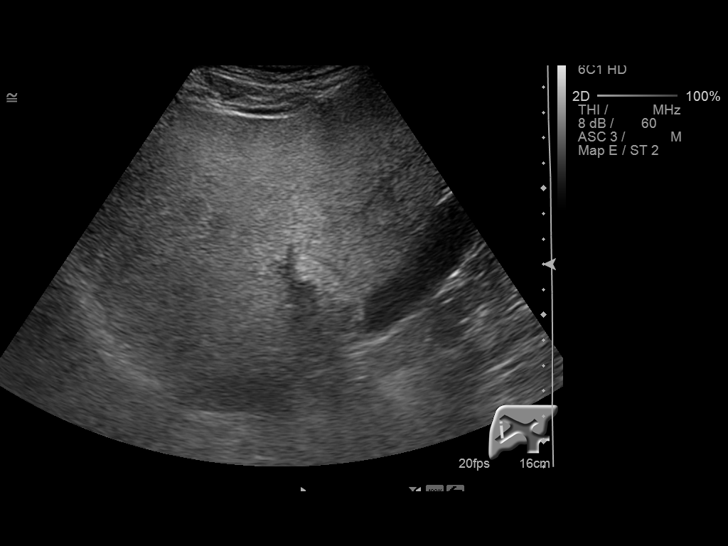
[im 41/49]
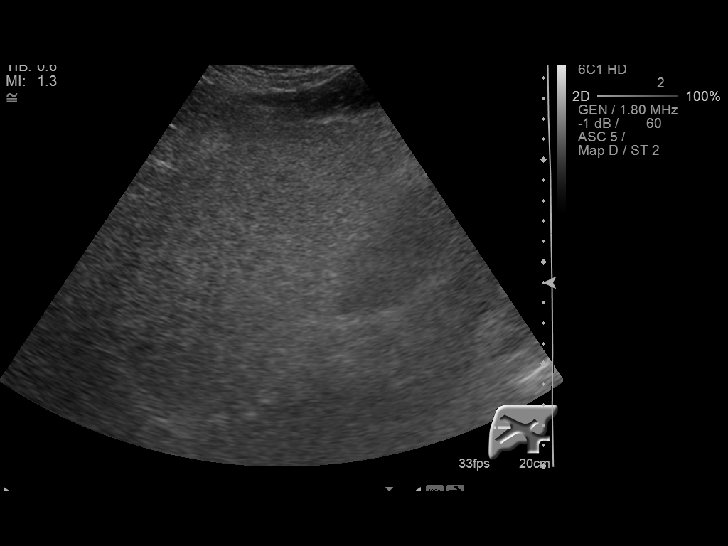
[im 45/49]
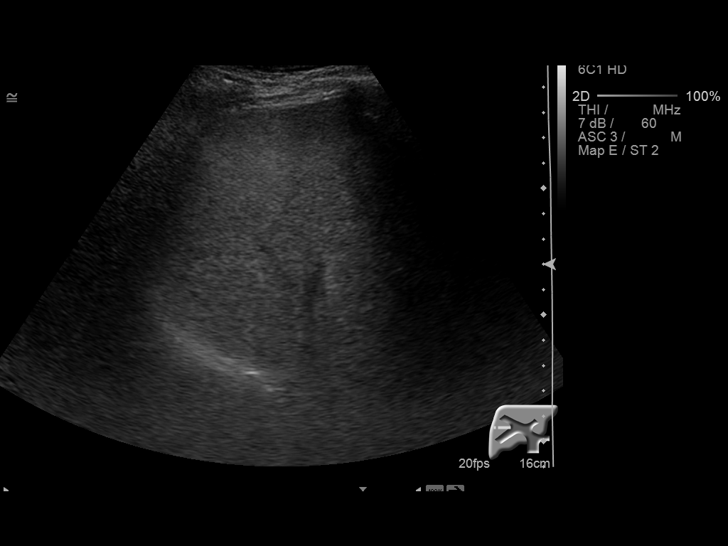
[im 49/49]
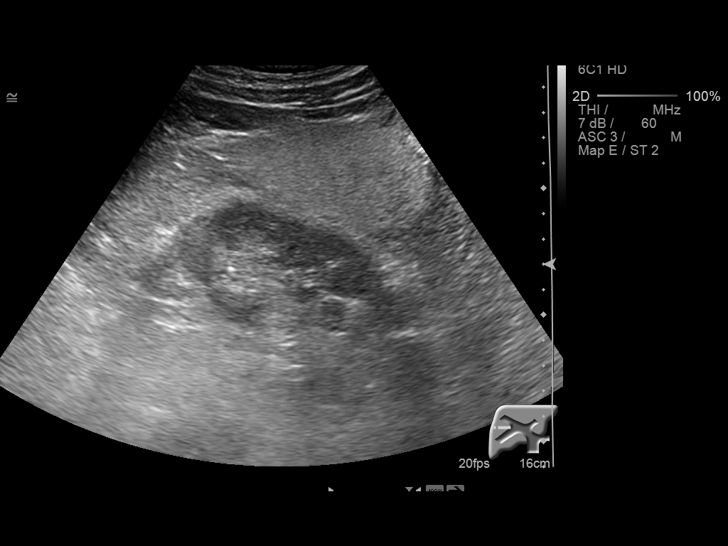

[14 of 25 positions shown; findings below may reference images not displayed]

FINDINGS: Gallbladder:

No gallstones or wall thickening visualized. No sonographic Murphy
sign noted.

Common bile duct:

Diameter: 2.8 mm

Liver:

The hepatic echotexture is mildly increased diffusely. There is no
focal mass or ductal dilation. The surface contour is normal.
IMPRESSION: Fatty infiltrative change of the liver. No acute abnormality is
demonstrated.

## 2016-04-21 ENCOUNTER — Encounter: Payer: Self-pay | Admitting: Primary Care

## 2016-04-21 ENCOUNTER — Ambulatory Visit (INDEPENDENT_AMBULATORY_CARE_PROVIDER_SITE_OTHER): Payer: 59 | Admitting: Primary Care

## 2016-04-21 ENCOUNTER — Encounter (INDEPENDENT_AMBULATORY_CARE_PROVIDER_SITE_OTHER): Payer: Self-pay

## 2016-04-21 VITALS — BP 122/76 | HR 76 | Temp 98.2°F | Ht 67.0 in | Wt 170.1 lb

## 2016-04-21 DIAGNOSIS — J209 Acute bronchitis, unspecified: Secondary | ICD-10-CM | POA: Diagnosis not present

## 2016-04-21 MED ORDER — DOXYCYCLINE HYCLATE 100 MG PO TABS: 100.0000 mg | ORAL_TABLET | Freq: Two times a day (BID) | ORAL | 0 refills | Status: DC

## 2016-04-21 MED ORDER — ALBUTEROL SULFATE HFA 108 (90 BASE) MCG/ACT IN AERS: 2.0000 | INHALATION_SPRAY | Freq: Four times a day (QID) | RESPIRATORY_TRACT | 0 refills | Status: DC | PRN

## 2016-04-21 NOTE — Progress Notes (Signed)
Pre visit review using our clinic review tool, if applicable. No additional management support is needed unless otherwise documented below in the visit note. 

## 2016-04-21 NOTE — Progress Notes (Signed)
Subjective:    Patient ID: Andrew Farrell, male    DOB: [birth year], 61 y.o.   MRN: [id number]  HPI  Andrew Farrell is a 61 year old male with a history of allergic rhinitis and tobacco abuse (quit in 2011)who presents today with a chief complaint of cough. He also reports chest congestion and wheezing. His symptoms began two weeks ago while in [place]. He returned from a trip to [place] 1 week ago. He's been taking Nyquil and Mucinex without much improvement. He denies fevers, sore throat. His cough is mostly non productive. Overall he's feeling worse.  Review of Systems  Constitutional: Positive for chills and fatigue. Negative for fever.  HENT: Positive for congestion and sore throat. Negative for sinus pressure.   Respiratory: Positive for cough and wheezing.   Cardiovascular: Negative for chest pain.       Past Medical History:  Diagnosis Date  . Alcohol abuse   . Anxiety   . BPH (benign prostatic hyperplasia)   . Depression   . Fatty liver   . Hyperlipidemia   . Hypertension   . Hypogonadism in male   . Polycythemia      Social History   Social History  . Marital status: Married    Spouse name: N/A  . Number of children: N/A  . Years of education: N/A   Occupational History  . Not on file.   Social History Main Topics  . Smoking status: Former Smoker    Quit date: 03/22/2009  . Smokeless tobacco: Never Used  . Alcohol use 6.0 oz/week    10 Cans of beer per week  . Drug use: No  . Sexual activity: Not on file   Other Topics Concern  . Not on file   Social History Narrative   Married, Press photographer.     Past Surgical History:  Procedure Laterality Date  . COLONOSCOPY WITH PROPOFOL N/A 03/23/2015   Procedure: COLONOSCOPY WITH PROPOFOL;  Surgeon: Lollie Sails, MD;  Location: [facility] ENDOSCOPY;  Service: Endoscopy;  Laterality: N/A;    No family history on file.  Allergies  Allergen Reactions  . Buspirone Hcl     REACTION: severe ha  . Escitalopram Oxalate      REACTION: made negative    Current Outpatient Prescriptions on File Prior to Visit  Medication Sig Dispense Refill  . lisinopril-hydrochlorothiazide (PRINZIDE,ZESTORETIC) 10-12.5 MG tablet TAKE ONE TABLET BY MOUTH ONCE DAILY 90 tablet 3  . Probiotic Product (PROBIOTIC PO) Take 1 tablet by mouth daily.    Marland Kitchen testosterone cypionate (DEPOTESTOSTERONE CYPIONATE) 200 MG/ML injection Inject 200 mg into the muscle every 14 (fourteen) days.     No current facility-administered medications on file prior to visit.     BP 122/76   Pulse 76   Temp 98.2 F (36.8 C) (Oral)   Ht 5\' 7"  (1.702 m)   Wt 170 lb 1.9 oz (77.2 kg)   SpO2 98%   BMI 26.64 kg/m    Objective:   Physical Exam  Constitutional: He appears well-nourished. He appears ill.  HENT:  Right Ear: Tympanic membrane and ear canal normal.  Left Ear: Tympanic membrane and ear canal normal.  Nose: No mucosal edema. Right sinus exhibits no maxillary sinus tenderness and no frontal sinus tenderness. Left sinus exhibits no maxillary sinus tenderness and no frontal sinus tenderness.  Mouth/Throat: Oropharynx is clear and moist.  Eyes: Conjunctivae are normal.  Neck: Neck supple.  Cardiovascular: Normal rate and regular rhythm.   Pulmonary/Chest:  Effort normal. He has no decreased breath sounds. He has wheezes in the right upper field and the left upper field. He has rhonchi in the right upper field, the right lower field, the left upper field and the left lower field. He has no rales.  Skin: Skin is warm and dry.          Assessment & Plan:  Acute Bronchitis:  Traveling overseas in [place] for several weeks. Overall no improvement since returning to the states. Exam today with moderate rhonchi and mild wheezing to most fields. He does appear acutely ill. Given international travel and no improvement in symptoms, will treat for likely bacterial involvement. Rx for Doxycycline course (treat atypicals), and albuterol inhaler  PRN. Restart Mucinex. Discussed hydration and rest.  Sheral Flow, NP

## 2016-04-21 NOTE — Patient Instructions (Addendum)
Start Doxycycline antibiotic. Take 1 tablet by mouth twice daily for 10 days.  Shortness of Breath/Wheezing/Cough: Use the albuterol inhaler. Inhale 2 puffs into the lungs every 6 to 8 hours as needed for wheezing and/or shortness of breath.   Restart Mucinex for cough and congestion.  Ensure you are staying hydrated with water and rest.  It was a pleasure meeting you!

## 2016-05-14 ENCOUNTER — Ambulatory Visit (INDEPENDENT_AMBULATORY_CARE_PROVIDER_SITE_OTHER)
Admission: RE | Admit: 2016-05-14 | Discharge: 2016-05-14 | Disposition: A | Discharge status: Home / Self Care | Payer: 59 | Source: Ambulatory Visit | Attending: Family Medicine | Admitting: Family Medicine

## 2016-05-14 ENCOUNTER — Ambulatory Visit (INDEPENDENT_AMBULATORY_CARE_PROVIDER_SITE_OTHER): Payer: 59 | Admitting: Family Medicine

## 2016-05-14 ENCOUNTER — Encounter: Payer: Self-pay | Admitting: Family Medicine

## 2016-05-14 VITALS — BP 90/60 | HR 93 | Temp 97.7°F | Ht 67.0 in | Wt 168.5 lb

## 2016-05-14 DIAGNOSIS — R05 Cough: Secondary | ICD-10-CM

## 2016-05-14 DIAGNOSIS — R062 Wheezing: Secondary | ICD-10-CM | POA: Diagnosis not present

## 2016-05-14 DIAGNOSIS — Z87891 Personal history of nicotine dependence: Secondary | ICD-10-CM

## 2016-05-14 DIAGNOSIS — R059 Cough, unspecified: Secondary | ICD-10-CM

## 2016-05-14 MED ORDER — PREDNISONE 20 MG PO TABS: ORAL_TABLET | ORAL | 0 refills | Status: DC

## 2016-05-14 NOTE — Progress Notes (Signed)
Dr. Frederico Hamman T. Allene Furuya, MD, [facility] [place], [address] Phone: [phone] Fax: [phone]  05/14/2016  Patient: Andrew Farrell, MRN: [id number], DOB: [birth year], 61 y.o.  Primary Physician:  Eliezer Lofts, MD   Chief Complaint  Patient presents with  . Chest Congestion    seen 2 weeks ago-completed antibiotics  . Cough   Subjective:   Andrew Farrell is a 61 y.o. very pleasant male patient who presents with the following:  Went to [place] a few weeks. Got sick and had a bad sore throat, then got some wheezing and coughing. Pollution was terrible.  Coughing and congestion still there.   He was seen on April 21, 2016, and at that point he was placed on Zithromax.  He has had some persistent wheezing since then.  He does have a long-standing smoking history, but he quit in 2011.  Past Medical History, Surgical History, Social History, Family History, Problem List, Medications, and Allergies have been reviewed and updated if relevant.  Patient Active Problem List   Diagnosis Date Noted  . Erythrocytosis 02/07/2016  . Syncope 02/07/2016  . Great toe pain, right 10/30/2015  . Controlled type 2 diabetes mellitus without complication, without long-term current use of insulin ([place]) 10/30/2015  . Depression, major, in remission ([place]) 07/15/2012  . HYPERCHOLESTEROLEMIA 08/21/2009  . TRANSAMINASES, SERUM, ELEVATED 08/21/2009  . HYPOGONADISM 10/26/2008  . DEVIATED NASAL SEPTUM 08/24/2008  . ALLERGIC RHINITIS 08/24/2008  . Essential hypertension, benign 05/14/2006  . ANXIETY 04/09/2006  . TOBACCO ABUSE 04/08/2006    Past Medical History:  Diagnosis Date  . Alcohol abuse   . Anxiety   . BPH (benign prostatic hyperplasia)   . Depression   . Fatty liver   . Hyperlipidemia   . Hypertension   . Hypogonadism in male   . Polycythemia     Past Surgical History:  Procedure Laterality Date  . COLONOSCOPY WITH  PROPOFOL N/A 03/23/2015   Procedure: COLONOSCOPY WITH PROPOFOL;  Surgeon: Lollie Sails, MD;  Location: [facility] ENDOSCOPY;  Service: Endoscopy;  Laterality: N/A;    Social History   Social History  . Marital status: Married    Spouse name: N/A  . Number of children: N/A  . Years of education: N/A   Occupational History  . Not on file.   Social History Main Topics  . Smoking status: Former Smoker    Quit date: 03/22/2009  . Smokeless tobacco: Never Used  . Alcohol use 6.0 oz/week    10 Cans of beer per week  . Drug use: No  . Sexual activity: Not on file   Other Topics Concern  . Not on file   Social History Narrative   Married, Press photographer.     No family history on file.  Allergies  Allergen Reactions  . Buspirone Hcl     REACTION: severe ha  . Escitalopram Oxalate     REACTION: made negative    Medication list reviewed and updated in full in [place].  ROS: GEN: Acute illness details above GI: Tolerating PO intake GU: maintaining adequate hydration and urination Pulm: No SOB Interactive and getting along well at home.  Otherwise, ROS is as per the HPI.  Objective:   BP 90/60   Pulse 93   Temp 97.7 F (36.5 C) (Oral)   Ht 5\' 7"  (1.702 m)   Wt 168 lb 8 oz (76.4 kg)   SpO2 92%  BMI 26.39 kg/m    GEN: A and O x 3. WDWN. NAD.    ENT: Nose clear, ext NML.  No LAD.  No JVD.  TM's clear. Oropharynx clear.  PULM: Normal WOB, no distress. Intermittent bilateral wheezing throughout.  There are also some rhonchorous sounds. CV: RRR, no M/G/R, No rubs, No JVD.   EXT: warm and well-perfused, No c/c/e. PSYCH: Pleasant and conversant.    Laboratory and Imaging Data: Dg Chest 2 View  Result Date: 05/14/2016 CLINICAL DATA:  Cough for 2 weeks EXAM: CHEST  2 VIEW COMPARISON:  None. FINDINGS: There is no edema or consolidation. Heart is slightly enlarged with pulmonary vascularity within normal limits. No adenopathy. There is evidence of old rib trauma  involving the left fourth rib. There is mild degenerative change in thoracic spine. IMPRESSION: Old trauma involving the left fourth rib. No edema or consolidation. Mild cardiac enlargement. Electronically Signed   By: Lowella Grip III M.D.   On: 05/14/2016 15:10     Assessment and Plan:   Wheezing  Cough - Plan: DG Chest 2 View  History of cigarette smoking - Plan: DG Chest 2 View  Persistent wheezing and coughing.  More upper airway and respiratory symptoms have improved.  No pneumonia on chest x-ray.  In a setting of significant smoking history, underlying pulmonary disease cannot be excluded such as COPD.  Exposure to pollutants in [place] and pollen may be contributing also.  Oral prednisone.  Follow-up: No Follow-up on file.  Meds ordered this encounter  Medications  . predniSONE (DELTASONE) 20 MG tablet    Sig: 2 tabs po daily x 5 days, then 1 tab po daily x 5 days    Dispense:  15 tablet    Refill:  0   Medications Discontinued During This Encounter  Medication Reason  . doxycycline (VIBRA-TABS) 100 MG tablet Completed Course   Orders Placed This Encounter  Procedures  . DG Chest 2 View    Signed,  Frederico Hamman T. Amilah Greenspan, MD   Allergies as of 05/14/2016      Reactions   Buspirone Hcl    REACTION: severe ha   Escitalopram Oxalate    REACTION: made negative      Medication List       Accurate as of 05/14/16 11:59 PM. Always use your most recent med list.          albuterol 108 (90 Base) MCG/ACT inhaler Commonly known as:  PROVENTIL HFA;VENTOLIN HFA Inhale 2 puffs into the lungs every 6 (six) hours as needed for wheezing or shortness of breath.   lisinopril-hydrochlorothiazide 10-12.5 MG tablet Commonly known as:  PRINZIDE,ZESTORETIC TAKE ONE TABLET BY MOUTH ONCE DAILY   predniSONE 20 MG tablet Commonly known as:  DELTASONE 2 tabs po daily x 5 days, then 1 tab po daily x 5 days   PROBIOTIC PO Take 1 tablet by mouth daily.   testosterone cypionate  200 MG/ML injection Commonly known as:  DEPOTESTOSTERONE CYPIONATE Inject 200 mg into the muscle every 14 (fourteen) days.

## 2016-05-14 NOTE — Progress Notes (Signed)
Pre visit review using our clinic review tool, if applicable. No additional management support is needed unless otherwise documented below in the visit note. 

## 2016-08-10 ENCOUNTER — Other Ambulatory Visit: Payer: Self-pay | Admitting: Family Medicine

## 2016-08-11 ENCOUNTER — Telehealth: Payer: Self-pay | Admitting: *Deleted

## 2016-08-11 MED ORDER — LISINOPRIL-HYDROCHLOROTHIAZIDE 20-25 MG PO TABS: 0.5000 | ORAL_TABLET | Freq: Every day | ORAL | 3 refills | Status: DC

## 2016-08-11 NOTE — Telephone Encounter (Signed)
Received fax from [facility] stating Andrew Farrell is out of refills on his Lisinopril-HCTZ 10-12.5 mg.  It is now off the $4 list but Lisinopril 20-25 mg is still on list.  Please advise.

## 2016-08-11 NOTE — Telephone Encounter (Signed)
Mr. Andrew Farrell notified by telephone of change in dosage of his [place].  He states understanding that when he starts the new dose he will only take 1/2 tablet daily.

## 2016-08-11 NOTE — Telephone Encounter (Signed)
Left message for Andrew Farrell to return my call.

## 2016-08-11 NOTE — Telephone Encounter (Signed)
He can cahnge to 1/2 tabs daily of the higher dose to get to his usual 10/12.5 mg daily. Change rx to reflect this and refill for 1 year.

## 2016-10-27 ENCOUNTER — Ambulatory Visit (HOSPITAL_COMMUNITY): Payer: 59

## 2017-07-22 ENCOUNTER — Ambulatory Visit (INDEPENDENT_AMBULATORY_CARE_PROVIDER_SITE_OTHER): Payer: 59 | Admitting: Family Medicine

## 2017-07-22 ENCOUNTER — Encounter: Payer: Self-pay | Admitting: Family Medicine

## 2017-07-22 VITALS — BP 116/50 | HR 82 | Temp 97.8°F | Ht 67.0 in | Wt 159.0 lb

## 2017-07-22 DIAGNOSIS — E7800 Pure hypercholesterolemia, unspecified: Secondary | ICD-10-CM | POA: Diagnosis not present

## 2017-07-22 DIAGNOSIS — I10 Essential (primary) hypertension: Secondary | ICD-10-CM

## 2017-07-22 DIAGNOSIS — R718 Other abnormality of red blood cells: Secondary | ICD-10-CM | POA: Diagnosis not present

## 2017-07-22 DIAGNOSIS — M2550 Pain in unspecified joint: Secondary | ICD-10-CM

## 2017-07-22 LAB — CBC WITH DIFFERENTIAL/PLATELET
BASOS ABS: 0 10*3/uL (ref 0.0–0.1)
Basophils Relative: 0.6 % (ref 0.0–3.0)
EOS PCT: 2 % (ref 0.0–5.0)
Eosinophils Absolute: 0.1 10*3/uL (ref 0.0–0.7)
HCT: 48.5 % (ref 39.0–52.0)
Hemoglobin: 16.6 g/dL (ref 13.0–17.0)
LYMPHS ABS: 1.2 10*3/uL (ref 0.7–4.0)
Lymphocytes Relative: 20 % (ref 12.0–46.0)
MCHC: 34.2 g/dL (ref 30.0–36.0)
MCV: 90.6 fl (ref 78.0–100.0)
MONOS PCT: 7.8 % (ref 3.0–12.0)
Monocytes Absolute: 0.5 10*3/uL (ref 0.1–1.0)
NEUTROS ABS: 4.3 10*3/uL (ref 1.4–7.7)
Neutrophils Relative %: 69.6 % (ref 43.0–77.0)
PLATELETS: 269 10*3/uL (ref 150.0–400.0)
RBC: 5.35 Mil/uL (ref 4.22–5.81)
RDW: 14.5 % (ref 11.5–15.5)
WBC: 6.2 10*3/uL (ref 4.0–10.5)

## 2017-07-22 LAB — LIPID PANEL
CHOL/HDL RATIO: 4
Cholesterol: 209 mg/dL — ABNORMAL HIGH (ref 0–200)
HDL: 47.4 mg/dL (ref >39.00)
LDL CALC: 132 mg/dL — AB (ref 0–99)
NONHDL: 161.31
Triglycerides: 147 mg/dL (ref 0.0–149.0)
VLDL: 29.4 mg/dL (ref 0.0–40.0)

## 2017-07-22 LAB — COMPREHENSIVE METABOLIC PANEL
ALT: 17 U/L (ref 0–53)
AST: 14 U/L (ref 0–37)
Albumin: 4.2 g/dL (ref 3.5–5.2)
Alkaline Phosphatase: 47 U/L (ref 39–117)
BUN: 12 mg/dL (ref 6–23)
CO2: 29 meq/L (ref 19–32)
Calcium: 9.7 mg/dL (ref 8.4–10.5)
Chloride: 102 mEq/L (ref 96–112)
Creatinine, Ser: 0.82 mg/dL (ref 0.40–1.50)
GFR: 101.47 mL/min (ref >60.00)
Glucose, Bld: 127 mg/dL — ABNORMAL HIGH (ref 70–99)
POTASSIUM: 3.8 meq/L (ref 3.5–5.1)
Sodium: 138 mEq/L (ref 135–145)
Total Bilirubin: 0.6 mg/dL (ref 0.2–1.2)
Total Protein: 6.7 g/dL (ref 6.0–8.3)

## 2017-07-22 LAB — SEDIMENTATION RATE: Sed Rate: 45 mm/hr — ABNORMAL HIGH (ref 0–20)

## 2017-07-22 NOTE — Patient Instructions (Signed)
Good to see you today  I will notify you of lab results  Continue Tylenol Arthritis, be mindful of your positions of your arms

## 2017-07-22 NOTE — Progress Notes (Signed)
Subjective:    Patient ID: Andrew Farrell, male    DOB: 07/06/1955, 62 y.o.   MRN: [id number]  HPI This is a 62 yo male who presents today with numbness of left shoulder and left toe x 1 week. A couple of weeks ago he dropped a Conservation officer, nature and strained his arm grabbing it. 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 Hands with increased pain, has had arthritis in past. He has not had any weakness or significant pain. No loss of function.  Has been taking Tylenol arthritis with good relief.  He has been watching his diet. Does not drink alcohol anymore.   Past Medical History:  Diagnosis Date  . Alcohol abuse   . Anxiety   . BPH (benign prostatic hyperplasia)   . Depression   . Fatty liver   . Hyperlipidemia   . Hypertension   . Hypogonadism in male   . Polycythemia    Past Surgical History:  Procedure Laterality Date  . COLONOSCOPY WITH PROPOFOL N/A 03/23/2015   Procedure: COLONOSCOPY WITH PROPOFOL;  Surgeon: Lollie Sails, MD;  Location[facility] ENDOSCOPY;  Service: Endoscopy;  Laterality: N/A;   No family history on file. Social History   Tobacco Use  . Smoking status: Former Smoker    Last attempt to quit: 03/22/2009    Years since quitting: 8.3  . Smokeless tobacco: Never Used  Substance Use Topics  . Alcohol use: Yes    Alcohol/week: 6.0 oz    Types: 10 Cans of beer per week  . Drug use: No      Review of Systems Per HPI    Objective:   Physical Exam  Constitutional: He is oriented to person, place, and time. He appears well-developed and well-nourished. No distress.  HENT:  Head: Normocephalic and atraumatic.  Eyes: Conjunctivae are normal.  Cardiovascular: Normal rate, regular rhythm and normal heart sounds.  Pulmonary/Chest: Effort normal and breath sounds normal.  Musculoskeletal: He exhibits no edema.       Left shoulder: He exhibits normal range of motion, no tenderness and no bony tenderness.  UE strength 5/5. Left foot  with good ROM, normal temperature, no discoloration, palpable pulses.   Neurological: He is alert and oriented to person, place, and time.  Skin: Skin is warm and dry. He is not diaphoretic.  Psychiatric: He has a normal mood and affect. His behavior is normal. Judgment and thought content normal.  Vitals reviewed.     BP (!) 116/50 (BP Location: Right Arm, Patient Position: Sitting, Cuff Size: Normal)   Pulse 82   Temp 97.8 F (36.6 C) (Oral)   Ht 5\' 7"  (1.702 m)   Wt 159 lb (72.1 kg)   SpO2 98%   BMI 24.90 kg/m  Wt Readings from Last 3 Encounters:  07/22/17 159 lb (72.1 kg)  05/14/16 168 lb 8 oz (76.4 kg)  04/21/16 170 lb 1.9 oz (77.2 kg)       Assessment & Plan:  1. HYPERCHOLESTEROLEMIA - Lipid Panel  2. Elevated hematocrit- noted from previous labs, will recheck today - CBC with Differential  3. Essential hypertension - CBC with Differential - Comprehensive metabolic panel  4. Arthralgia, unspecified joint - Sedimentation rate - ANA -  Patient Instructions  Good to see you today  I will notify you of lab results  Continue Tylenol Arthritis, be mindful of your positions of your arms   Follow up with PCP for annual exam   Neoma Laming  Carlean Purl, FNP-BC  [facility], [facility]  07/29/2017 8:46 AM

## 2017-07-24 LAB — ANA: ANA: NEGATIVE

## 2017-07-29 ENCOUNTER — Encounter: Payer: Self-pay | Admitting: Family Medicine

## 2017-08-25 ENCOUNTER — Other Ambulatory Visit: Payer: Self-pay | Admitting: Family Medicine

## 2017-11-06 ENCOUNTER — Telehealth: Payer: Self-pay | Admitting: Family Medicine

## 2017-11-06 DIAGNOSIS — E119 Type 2 diabetes mellitus without complications: Secondary | ICD-10-CM

## 2017-11-06 DIAGNOSIS — Z125 Encounter for screening for malignant neoplasm of prostate: Secondary | ICD-10-CM

## 2017-11-06 NOTE — Telephone Encounter (Signed)
-----   Message from Lendon Collar, RT sent at 11/03/2017  9:30 AM EDT ----- Regarding: Lab orders for Monday 11/09/17 Please enter CPE lab orders for 11/09/17. Thanks!

## 2017-11-09 ENCOUNTER — Other Ambulatory Visit (INDEPENDENT_AMBULATORY_CARE_PROVIDER_SITE_OTHER): Payer: 59

## 2017-11-09 DIAGNOSIS — E119 Type 2 diabetes mellitus without complications: Secondary | ICD-10-CM | POA: Diagnosis not present

## 2017-11-09 DIAGNOSIS — Z125 Encounter for screening for malignant neoplasm of prostate: Secondary | ICD-10-CM

## 2017-11-09 LAB — LIPID PANEL
CHOLESTEROL: 187 mg/dL (ref 0–200)
HDL: 37.5 mg/dL — AB (ref >39.00)
LDL Cholesterol: 127 mg/dL — ABNORMAL HIGH (ref 0–99)
NONHDL: 149.63
Total CHOL/HDL Ratio: 5
Triglycerides: 112 mg/dL (ref 0.0–149.0)
VLDL: 22.4 mg/dL (ref 0.0–40.0)

## 2017-11-09 LAB — COMPREHENSIVE METABOLIC PANEL
ALBUMIN: 4.2 g/dL (ref 3.5–5.2)
ALK PHOS: 51 U/L (ref 39–117)
ALT: 13 U/L (ref 0–53)
AST: 10 U/L (ref 0–37)
BUN: 17 mg/dL (ref 6–23)
CALCIUM: 9.7 mg/dL (ref 8.4–10.5)
CO2: 28 mEq/L (ref 19–32)
Chloride: 103 mEq/L (ref 96–112)
Creatinine, Ser: 0.96 mg/dL (ref 0.40–1.50)
GFR: 84.51 mL/min (ref >60.00)
GLUCOSE: 116 mg/dL — AB (ref 70–99)
POTASSIUM: 4 meq/L (ref 3.5–5.1)
Sodium: 139 mEq/L (ref 135–145)
TOTAL PROTEIN: 6.2 g/dL (ref 6.0–8.3)
Total Bilirubin: 0.3 mg/dL (ref 0.2–1.2)

## 2017-11-09 LAB — HEMOGLOBIN A1C: Hgb A1c MFr Bld: 6.2 % (ref 4.6–6.5)

## 2017-11-09 LAB — PSA: PSA: 1.05 ng/mL (ref 0.10–4.00)

## 2017-11-12 ENCOUNTER — Ambulatory Visit (INDEPENDENT_AMBULATORY_CARE_PROVIDER_SITE_OTHER): Payer: 59 | Admitting: Family Medicine

## 2017-11-12 ENCOUNTER — Encounter: Payer: Self-pay | Admitting: Family Medicine

## 2017-11-12 VITALS — BP 130/66 | HR 78 | Temp 98.4°F | Ht 67.0 in | Wt 163.2 lb

## 2017-11-12 DIAGNOSIS — R7303 Prediabetes: Secondary | ICD-10-CM

## 2017-11-12 DIAGNOSIS — Z23 Encounter for immunization: Secondary | ICD-10-CM

## 2017-11-12 DIAGNOSIS — F325 Major depressive disorder, single episode, in full remission: Secondary | ICD-10-CM

## 2017-11-12 DIAGNOSIS — E7800 Pure hypercholesterolemia, unspecified: Secondary | ICD-10-CM | POA: Diagnosis not present

## 2017-11-12 DIAGNOSIS — I10 Essential (primary) hypertension: Secondary | ICD-10-CM | POA: Diagnosis not present

## 2017-11-12 DIAGNOSIS — Z0000 Encounter for general adult medical examination without abnormal findings: Secondary | ICD-10-CM

## 2017-11-12 NOTE — Addendum Note (Signed)
Addended by: Brenton Grills on: 16/55/3748 12:54 PM   Modules accepted: Orders

## 2017-11-12 NOTE — Assessment & Plan Note (Signed)
Stable control with 40 lb weight loss.

## 2017-11-12 NOTE — Patient Instructions (Signed)
Low chol diet, low carb diet, and keep up with the walking.

## 2017-11-12 NOTE — Assessment & Plan Note (Signed)
Resolved

## 2017-11-12 NOTE — Assessment & Plan Note (Signed)
Well controlled. Continue current medication.  

## 2017-11-12 NOTE — Assessment & Plan Note (Signed)
Improved control . Encouraged exercise, weight loss, healthy eating habits.  

## 2017-11-12 NOTE — Progress Notes (Signed)
Subjective:    Patient ID: Andrew Farrell, male    DOB: 03/21/1955, 62 y.o.   MRN: [id number]  HPI  The patient is here for annual wellness exam and preventative care.    Hypertension:   Good control on lisinopril HCTZ  BP Readings from Last 3 Encounters:  11/12/17 130/66  07/22/17 (!) 116/50  05/14/16 90/60  Using medication without problems or lightheadedness:  None Chest pain with exertion: none Edema:none Short of breath:none Average home BPs: good Other issues:  Seeing uro,  [facility] for testosterone, PSA. Lab Results  Component Value Date   PSA 1.05 11/09/2017   PSA 0.61 07/13/2015   PSA 0.63 11/18/2010     Alcohol abuse..  1 beer every 1-2 months. Much water intake  Anxiety and depression, well controlled. No issues in last year. Semi retired.  Elevated Cholesterol: LDL at Goal < 130. Lab Results  Component Value Date   CHOL 187 11/09/2017   HDL 37.50 (L) 11/09/2017   LDLCALC 127 (H) 11/09/2017   LDLDIRECT 152.6 08/15/2009   TRIG 112.0 11/09/2017   CHOLHDL 5 11/09/2017  Diet compliance: Moderate. Exercise: walking 2 miles  and day.  Prediabetes:   Lab Results  Component Value Date   HGBA1C 6.2 11/09/2017    Wt Readings from Last 3 Encounters:  11/12/17 163 lb 4 oz (74 kg)  07/22/17 159 lb (72.1 kg)  05/14/16 168 lb 8 oz (76.4 kg)     Elevated liver transaminases: Due for re-eval. Likely secondary to alcohol abuse. Improved in past with decreaed ETOH use,but back up now. Has never has US liver or hepatitis panel.    Social History /Family History/Past Medical History reviewed in detail and updated in EMR if needed. Blood pressure 130/66, pulse 78, temperature 98.4 F (36.9 C), temperature source Oral, height 5\' 7"  (1.702 m), weight 163 lb 4 oz (74 kg), SpO2 96 %.  Review of Systems  Constitutional: Negative for fatigue and fever.  HENT: Negative for ear pain.   Eyes: Negative for pain.  Respiratory: Negative for cough and  shortness of breath.   Cardiovascular: Negative for chest pain, palpitations and leg swelling.  Gastrointestinal: Negative for abdominal pain.  Genitourinary: Negative for dysuria.  Musculoskeletal: Negative for arthralgias.  Neurological: Negative for syncope, light-headedness and headaches.  Psychiatric/Behavioral: Negative for dysphoric mood.       Objective:   Physical Exam  Constitutional: He appears well-developed and well-nourished.  Non-toxic appearance. He does not appear ill. No distress.  HENT:  Head: Normocephalic and atraumatic.  Right Ear: Hearing, tympanic membrane, external ear and ear canal normal.  Left Ear: Hearing, tympanic membrane, external ear and ear canal normal.  Nose: Nose normal.  Mouth/Throat: Uvula is midline, oropharynx is clear and moist and mucous membranes are normal.  Eyes: Pupils are equal, round, and reactive to light. Conjunctivae, EOM and lids are normal. Lids are everted and swept, no foreign bodies found.  Neck: Trachea normal, normal range of motion and phonation normal. Neck supple. Carotid bruit is not present. No thyroid mass and no thyromegaly present.  Cardiovascular: Normal rate, regular rhythm, S1 normal, S2 normal, intact distal pulses and normal pulses. Exam reveals no gallop.  No murmur heard. Pulmonary/Chest: Breath sounds normal. He has no wheezes. He has no rhonchi. He has no rales.  Abdominal: Soft. Normal appearance and bowel sounds are normal. There is no hepatosplenomegaly. There is no tenderness. There is no rebound, no guarding and no CVA tenderness. No hernia.  Lymphadenopathy:    He has no cervical adenopathy.  Neurological: He is alert. He has normal strength and normal reflexes. No cranial nerve deficit or sensory deficit. Gait normal.  Skin: Skin is warm, dry and intact. No rash noted.  Psychiatric: He has a normal mood and affect. His speech is normal and behavior is normal. Judgment normal.          Assessment &  Plan:  The patient's preventative maintenance and recommended screening tests for an annual wellness exam were reviewed in full today. Brought up to date unless services declined.  Counselled on the importance of diet, exercise, and its role in overall health and mortality. The patient's FH and SH was reviewed, including their home life, tobacco status, and drug and alcohol status.  Colon: 2017 Dr. Gustavo Lah [facility], several sessile polyps:  recommended 3 years. PSA   Lab Results  Component Value Date   PSA 1.05 11/09/2017   PSA 0.61 07/13/2015   PSA 0.63 11/18/2010  Former smoker Quit 10 years ago. Asymptomatic. Limit ETOH.  Hep C screen. Neg  refused HIV screen

## 2017-11-27 ENCOUNTER — Other Ambulatory Visit: Payer: Self-pay | Admitting: Family Medicine

## 2017-12-29 LAB — HM DIABETES EYE EXAM

## 2017-12-31 ENCOUNTER — Encounter: Payer: Self-pay | Admitting: Family Medicine

## 2018-04-10 IMAGING — DX DG CHEST 2V
2 series · 2 of 2 positions shown · non-contrast
Comparison: None.

CLINICAL DATA: Cough for 2 weeks

EXAM:
CHEST  2 VIEW

[chest pa]
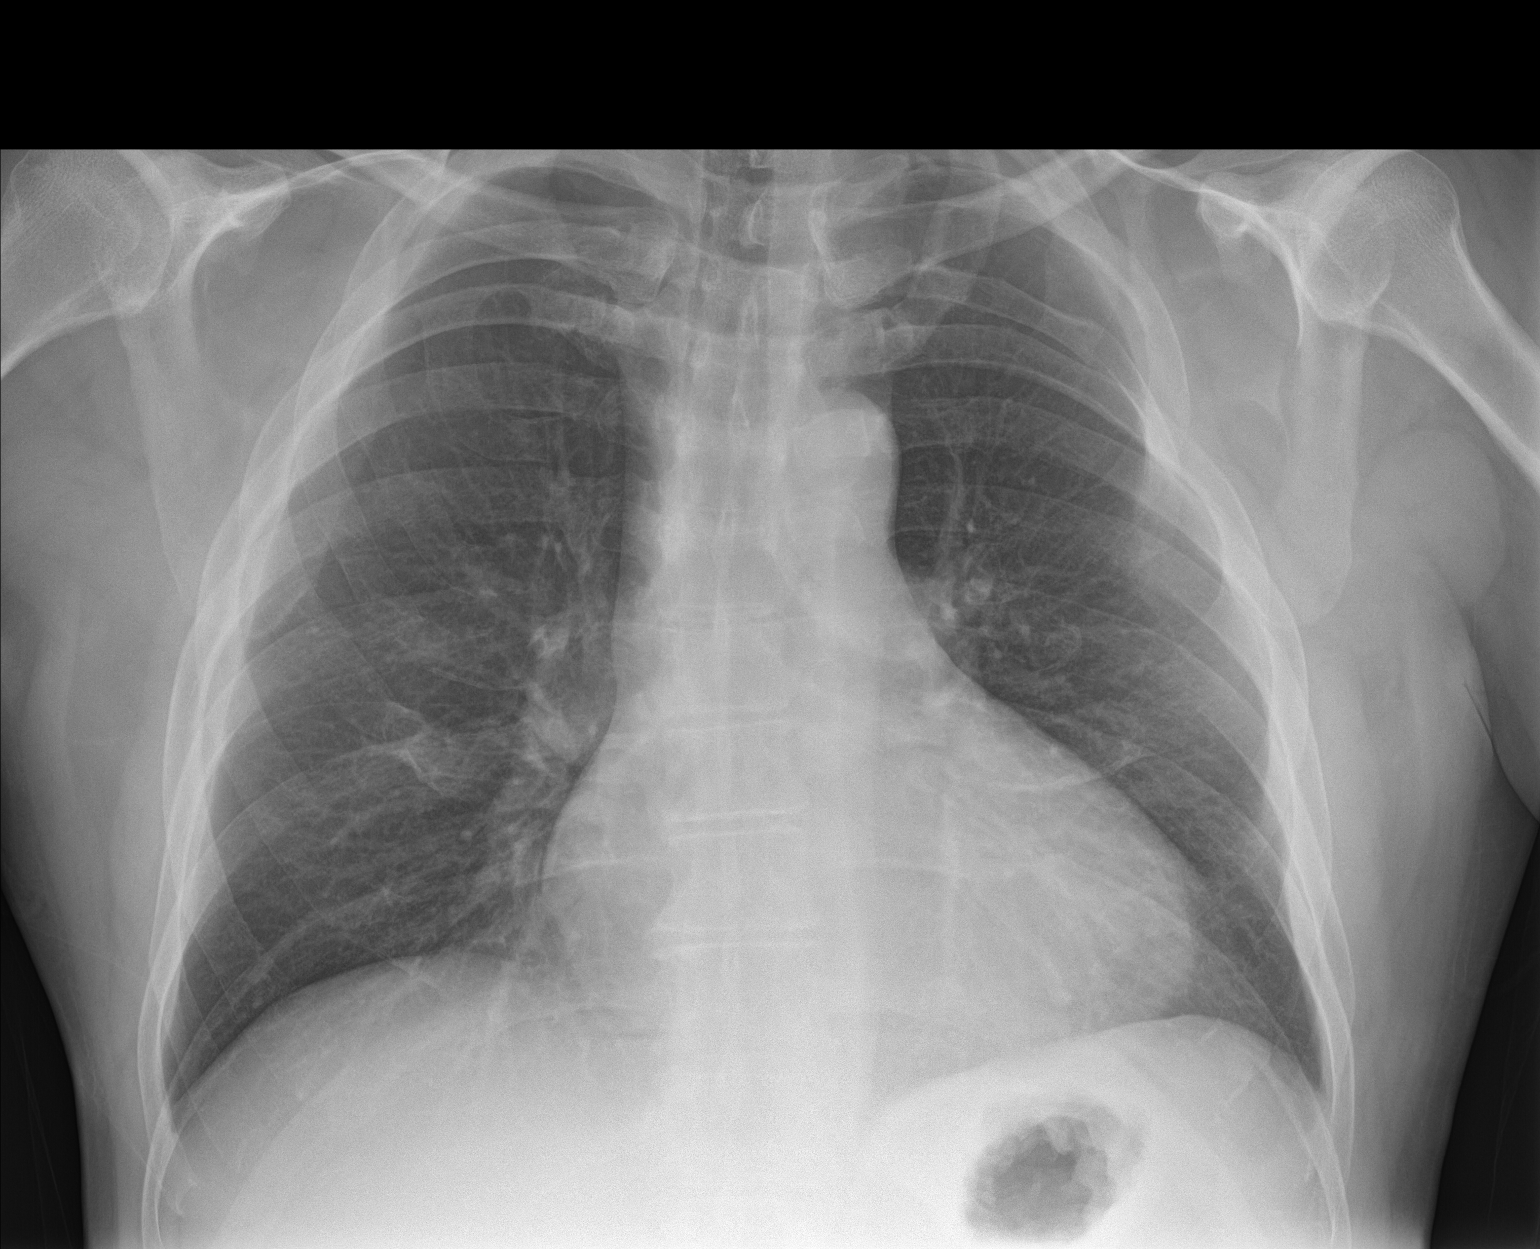

[chest lat]
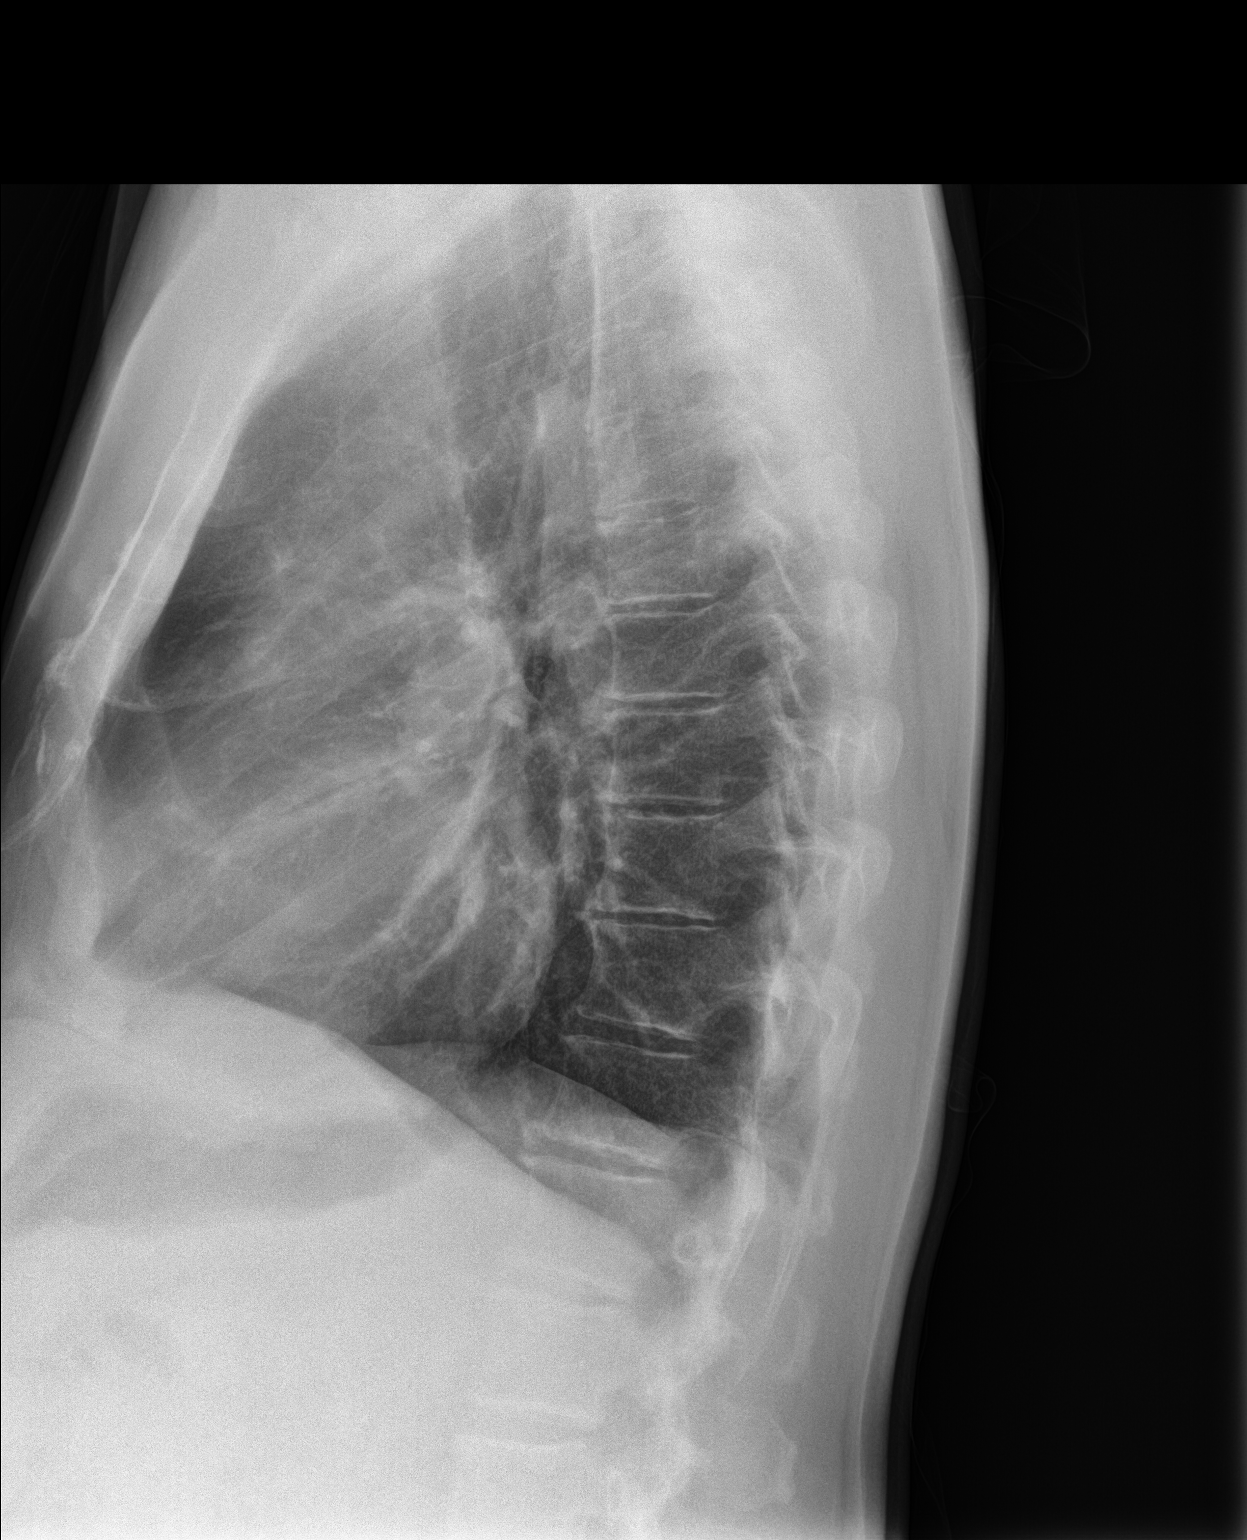

[2 of 2 positions shown; findings below may reference images not displayed]

FINDINGS: There is no edema or consolidation. Heart is slightly enlarged with
pulmonary vascularity within normal limits. No adenopathy. There is
evidence of old rib trauma involving the left fourth rib. There is
mild degenerative change in thoracic spine.
IMPRESSION: Old trauma involving the left fourth rib. No edema or consolidation.
Mild cardiac enlargement.

## 2018-08-23 DIAGNOSIS — N5201 Erectile dysfunction due to arterial insufficiency: Secondary | ICD-10-CM | POA: Diagnosis not present

## 2018-08-23 DIAGNOSIS — E291 Testicular hypofunction: Secondary | ICD-10-CM | POA: Diagnosis not present

## 2018-10-06 ENCOUNTER — Other Ambulatory Visit: Payer: Self-pay | Admitting: Family Medicine

## 2018-10-13 DIAGNOSIS — E291 Testicular hypofunction: Secondary | ICD-10-CM | POA: Diagnosis not present

## 2018-11-09 ENCOUNTER — Telehealth: Payer: Self-pay

## 2018-11-09 NOTE — Telephone Encounter (Signed)
LVM w COVID screen, back lab and front door info

## 2018-11-12 ENCOUNTER — Telehealth: Payer: Self-pay | Admitting: Family Medicine

## 2018-11-12 DIAGNOSIS — Z125 Encounter for screening for malignant neoplasm of prostate: Secondary | ICD-10-CM

## 2018-11-12 DIAGNOSIS — R7303 Prediabetes: Secondary | ICD-10-CM

## 2018-11-12 DIAGNOSIS — E7800 Pure hypercholesterolemia, unspecified: Secondary | ICD-10-CM

## 2018-11-12 NOTE — Telephone Encounter (Signed)
-----   Message from Ellamae Sia sent at 11/08/2018 11:45 AM EDT ----- Regarding: Lab orders for Monday, 11.2.20 Patient is scheduled for CPX labs, please order future labs, Thanks , Karna Christmas

## 2018-11-15 ENCOUNTER — Other Ambulatory Visit (INDEPENDENT_AMBULATORY_CARE_PROVIDER_SITE_OTHER): Payer: BC Managed Care – PPO

## 2018-11-15 DIAGNOSIS — Z125 Encounter for screening for malignant neoplasm of prostate: Secondary | ICD-10-CM

## 2018-11-15 DIAGNOSIS — E7800 Pure hypercholesterolemia, unspecified: Secondary | ICD-10-CM

## 2018-11-15 DIAGNOSIS — R7303 Prediabetes: Secondary | ICD-10-CM | POA: Diagnosis not present

## 2018-11-15 LAB — COMPREHENSIVE METABOLIC PANEL
ALT: 13 U/L (ref 0–53)
AST: 11 U/L (ref 0–37)
Albumin: 4.5 g/dL (ref 3.5–5.2)
Alkaline Phosphatase: 51 U/L (ref 39–117)
BUN: 12 mg/dL (ref 6–23)
CO2: 30 mEq/L (ref 19–32)
Calcium: 9.9 mg/dL (ref 8.4–10.5)
Chloride: 100 mEq/L (ref 96–112)
Creatinine, Ser: 0.97 mg/dL (ref 0.40–1.50)
GFR: 78.3 mL/min (ref >60.00)
Glucose, Bld: 137 mg/dL — ABNORMAL HIGH (ref 70–99)
Potassium: 4.1 mEq/L (ref 3.5–5.1)
Sodium: 140 mEq/L (ref 135–145)
Total Bilirubin: 0.7 mg/dL (ref 0.2–1.2)
Total Protein: 6.5 g/dL (ref 6.0–8.3)

## 2018-11-15 LAB — LIPID PANEL
Cholesterol: 226 mg/dL — ABNORMAL HIGH (ref 0–200)
HDL: 48.6 mg/dL (ref >39.00)
LDL Cholesterol: 156 mg/dL — ABNORMAL HIGH (ref 0–99)
NonHDL: 177.85
Total CHOL/HDL Ratio: 5
Triglycerides: 110 mg/dL (ref 0.0–149.0)
VLDL: 22 mg/dL (ref 0.0–40.0)

## 2018-11-15 LAB — HEMOGLOBIN A1C: Hgb A1c MFr Bld: 5.8 % (ref 4.6–6.5)

## 2018-11-15 LAB — PSA: PSA: 0.82 ng/mL (ref 0.10–4.00)

## 2018-11-16 NOTE — Progress Notes (Signed)
No critical labs need to be addressed urgently. We will discuss labs in detail at upcoming office visit.   

## 2018-11-19 ENCOUNTER — Encounter: Payer: 59 | Admitting: Family Medicine

## 2018-11-19 ENCOUNTER — Ambulatory Visit (INDEPENDENT_AMBULATORY_CARE_PROVIDER_SITE_OTHER): Payer: BC Managed Care – PPO | Admitting: Family Medicine

## 2018-11-19 ENCOUNTER — Other Ambulatory Visit: Payer: Self-pay

## 2018-11-19 ENCOUNTER — Encounter: Payer: Self-pay | Admitting: Family Medicine

## 2018-11-19 VITALS — BP 118/60 | HR 76 | Temp 98.7°F | Ht 66.75 in | Wt 149.5 lb

## 2018-11-19 DIAGNOSIS — Z0000 Encounter for general adult medical examination without abnormal findings: Secondary | ICD-10-CM | POA: Diagnosis not present

## 2018-11-19 DIAGNOSIS — I10 Essential (primary) hypertension: Secondary | ICD-10-CM

## 2018-11-19 DIAGNOSIS — F325 Major depressive disorder, single episode, in full remission: Secondary | ICD-10-CM

## 2018-11-19 DIAGNOSIS — E119 Type 2 diabetes mellitus without complications: Secondary | ICD-10-CM | POA: Diagnosis not present

## 2018-11-19 DIAGNOSIS — R7303 Prediabetes: Secondary | ICD-10-CM | POA: Diagnosis not present

## 2018-11-19 DIAGNOSIS — E7800 Pure hypercholesterolemia, unspecified: Secondary | ICD-10-CM

## 2018-11-19 NOTE — Patient Instructions (Addendum)
Work on low fat low chol diet... if interested return in 3 months to recheck cholesterol. Decrease butter and animal fats.  Can also start OTC red yeast rice 600 mg 2 tabs twice daily.  Can try OTC diclofenac gel for left hand pain.

## 2018-11-19 NOTE — Assessment & Plan Note (Signed)
Worsened control.  AHA 10 year CVD risk cal: 11.6% risk Statin indicated.

## 2018-11-19 NOTE — Assessment & Plan Note (Signed)
stable control.

## 2018-11-19 NOTE — Assessment & Plan Note (Signed)
Good control on lisinopril HCTZ.

## 2018-11-19 NOTE — Progress Notes (Signed)
Chief Complaint  Patient presents with  . Annual Exam    History of Present Illness: HPI  The patient is here for annual wellness exam and preventative care.     Anxiety and stress... resolved with retirement.  Hypertension:   Good control on lisinopril HCTZ.  Using medication without problems or lightheadedness: none Chest pain with exertion:none Edema:none Short of breath:none Average home BPs: Other issues:   prediabetes  Lab Results  Component Value Date   HGBA1C 5.8 11/15/2018    MDD:  PHQ9: 3  Elevated Cholesterol: Worsened control.  AHA 10 year CVD risk cal: 11.6% risk Statin indicated.  Has been eating more shrimp lately, candy. Lab Results  Component Value Date   CHOL 226 (H) 11/15/2018   HDL 48.60 11/15/2018   LDLCALC 156 (H) 11/15/2018   LDLDIRECT 152.6 08/15/2009   TRIG 110.0 11/15/2018   CHOLHDL 5 11/15/2018  Using medications without problems: Muscle aches:  Diet compliance: good Exercise: walks 2-4 miles per day Other complaints:  Body mass index is 23.59 kg/m.  Wt Readings from Last 3 Encounters:  11/19/18 149 lb 8 oz (67.8 kg)  11/12/17 163 lb 4 oz (74 kg)  07/22/17 159 lb (72.1 kg)      COVID 19 screen No recent travel or known exposure to COVID19 The patient denies respiratory symptoms of COVID 19 at this time.  The importance of social distancing was discussed today.   Review of Systems  Constitutional: Negative for chills and fever.  HENT: Negative for congestion and ear pain.   Eyes: Negative for pain and redness.  Respiratory: Negative for cough and shortness of breath.   Cardiovascular: Negative for chest pain, palpitations and leg swelling.  Gastrointestinal: Negative for abdominal pain, blood in stool, constipation, diarrhea, nausea and vomiting.  Genitourinary: Negative for dysuria.  Musculoskeletal: Negative for falls and myalgias.  Skin: Negative for rash.  Neurological: Negative for dizziness.   Psychiatric/Behavioral: Negative for depression. The patient is not nervous/anxious.      left hand pain.Marland Kitchen likely OA Past Medical History:  Diagnosis Date  . Alcohol abuse   . Anxiety   . BPH (benign prostatic hyperplasia)   . Depression   . Fatty liver   . Hyperlipidemia   . Hypertension   . Hypogonadism in male   . Polycythemia     reports that he quit smoking about 9 years ago. He has never used smokeless tobacco. He reports current alcohol use of about 10.0 standard drinks of alcohol per week. He reports that he does not use drugs.   Current Outpatient Medications:  .  lisinopril-hydrochlorothiazide (ZESTORETIC) 20-25 MG tablet, Take 0.5 tablets by mouth daily., Disp: 45 tablet, Rfl: 0 .  Probiotic Product (PROBIOTIC PO), Take 1 tablet by mouth daily., Disp: , Rfl:  .  sildenafil (VIAGRA) 100 MG tablet, Take 100 mg by mouth daily as needed., Disp: , Rfl:  .  testosterone cypionate (DEPOTESTOSTERONE CYPIONATE) 200 MG/ML injection, Inject 200 mg into the muscle every 14 (fourteen) days., Disp: , Rfl:    Observations/Objective: Blood pressure 118/60, pulse 76, temperature 98.7 F (37.1 C), temperature source Temporal, height 5' 6.75" (1.695 m), weight 149 lb 8 oz (67.8 kg), SpO2 96 %.  Physical Exam Constitutional:      Appearance: He is well-developed. He is obese.  HENT:     Head: Normocephalic.     Right Ear: Hearing normal.     Left Ear: Hearing normal.     Nose: Nose normal.  Neck:     Thyroid: No thyroid mass or thyromegaly.     Vascular: No carotid bruit.     Trachea: Trachea normal.  Cardiovascular:     Rate and Rhythm: Normal rate and regular rhythm.     Pulses: Normal pulses.     Heart sounds: Heart sounds not distant. No murmur. No friction rub. No gallop.      Comments: No peripheral edema Pulmonary:     Effort: Pulmonary effort is normal. No respiratory distress.     Breath sounds: Normal breath sounds.  Skin:    General: Skin is warm and dry.      Findings: No rash.  Psychiatric:        Speech: Speech normal.        Behavior: Behavior normal.        Thought Content: Thought content normal.      Assessment and Plan   The patient's preventative maintenance and recommended screening tests for an annual wellness exam were reviewed in full today. Brought up to date unless services declined.  Counselled on the importance of diet, exercise, and its role in overall health and mortality. The patient's FH and SH was reviewed, including their home life, tobacco status, and drug and alcohol status.    Colon: 2017 Dr. Gustavo Lah [facility], several sessile polyps: recommended 3 years.Plans in near future when COVID calms down. PSA   Lab Results  Component Value Date   PSA 0.82 11/15/2018   PSA 1.05 11/09/2017   PSA 0.61 07/13/2015  Former smoker Quit 10 years ago. Asymptomatic. Limit ETOH. Hep C screen.Neg Refused HIV screen  Essential hypertension, benign Good control on lisinopril HCTZ.  Prediabetes stable control.  HYPERCHOLESTEROLEMIA Worsened control.  AHA 10 year CVD risk cal: 11.6% risk Statin indicated.  Depression, major, in remission Stable control.    Eliezer Lofts, MD

## 2018-11-20 NOTE — Assessment & Plan Note (Signed)
Stable control. 

## 2018-12-24 DIAGNOSIS — E291 Testicular hypofunction: Secondary | ICD-10-CM | POA: Diagnosis not present

## 2019-02-18 ENCOUNTER — Other Ambulatory Visit: Payer: Self-pay | Admitting: Family Medicine

## 2019-04-22 DIAGNOSIS — Z86010 Personal history of colonic polyps: Secondary | ICD-10-CM | POA: Diagnosis not present

## 2019-04-22 DIAGNOSIS — Z01818 Encounter for other preprocedural examination: Secondary | ICD-10-CM | POA: Diagnosis not present

## 2019-05-30 DIAGNOSIS — Z01818 Encounter for other preprocedural examination: Secondary | ICD-10-CM | POA: Diagnosis not present

## 2019-06-02 DIAGNOSIS — K648 Other hemorrhoids: Secondary | ICD-10-CM | POA: Diagnosis not present

## 2019-06-02 DIAGNOSIS — D126 Benign neoplasm of colon, unspecified: Secondary | ICD-10-CM | POA: Diagnosis not present

## 2019-06-02 DIAGNOSIS — K5520 Angiodysplasia of colon without hemorrhage: Secondary | ICD-10-CM | POA: Diagnosis not present

## 2019-06-02 DIAGNOSIS — Z86010 Personal history of colonic polyps: Secondary | ICD-10-CM | POA: Diagnosis not present

## 2019-06-02 DIAGNOSIS — D123 Benign neoplasm of transverse colon: Secondary | ICD-10-CM | POA: Diagnosis not present

## 2019-06-02 DIAGNOSIS — K635 Polyp of colon: Secondary | ICD-10-CM | POA: Diagnosis not present

## 2019-06-02 DIAGNOSIS — Z800 Family history of malignant neoplasm of digestive organs: Secondary | ICD-10-CM | POA: Diagnosis not present

## 2019-06-02 DIAGNOSIS — K5730 Diverticulosis of large intestine without perforation or abscess without bleeding: Secondary | ICD-10-CM | POA: Diagnosis not present

## 2019-06-30 DIAGNOSIS — E291 Testicular hypofunction: Secondary | ICD-10-CM | POA: Diagnosis not present

## 2019-06-30 DIAGNOSIS — N400 Enlarged prostate without lower urinary tract symptoms: Secondary | ICD-10-CM | POA: Diagnosis not present

## 2019-07-07 DIAGNOSIS — N400 Enlarged prostate without lower urinary tract symptoms: Secondary | ICD-10-CM | POA: Diagnosis not present

## 2019-07-07 DIAGNOSIS — E291 Testicular hypofunction: Secondary | ICD-10-CM | POA: Diagnosis not present

## 2019-07-07 DIAGNOSIS — N5201 Erectile dysfunction due to arterial insufficiency: Secondary | ICD-10-CM | POA: Diagnosis not present

## 2020-04-02 ENCOUNTER — Other Ambulatory Visit: Payer: Self-pay | Admitting: Family Medicine

## 2020-04-02 NOTE — Telephone Encounter (Signed)
Patient scheduled cpx on 05/08/20.

## 2020-04-02 NOTE — Telephone Encounter (Signed)
Left voice message to call the office  

## 2020-04-02 NOTE — Telephone Encounter (Signed)
Please schedule CPE with fasting labs prior with Dr. Diona Browner.  Please send back to me once scheduled to refill his medication.

## 2020-04-18 ENCOUNTER — Telehealth: Payer: Self-pay | Admitting: Family Medicine

## 2020-04-18 DIAGNOSIS — Z125 Encounter for screening for malignant neoplasm of prostate: Secondary | ICD-10-CM

## 2020-04-18 DIAGNOSIS — E119 Type 2 diabetes mellitus without complications: Secondary | ICD-10-CM

## 2020-04-18 NOTE — Telephone Encounter (Signed)
-----   Message from Ellamae Sia sent at 04/16/2020 10:56 AM EDT ----- Regarding: Lab orders for Tuesday, 4.5.22 Patient is scheduled for CPX labs, please order future labs, Thanks , Karna Christmas

## 2020-05-01 ENCOUNTER — Other Ambulatory Visit: Payer: Self-pay

## 2020-05-01 ENCOUNTER — Other Ambulatory Visit (INDEPENDENT_AMBULATORY_CARE_PROVIDER_SITE_OTHER): Payer: BLUE CROSS/BLUE SHIELD

## 2020-05-01 DIAGNOSIS — Z125 Encounter for screening for malignant neoplasm of prostate: Secondary | ICD-10-CM

## 2020-05-01 DIAGNOSIS — E119 Type 2 diabetes mellitus without complications: Secondary | ICD-10-CM | POA: Diagnosis not present

## 2020-05-01 LAB — COMPREHENSIVE METABOLIC PANEL
ALT: 19 U/L (ref 0–53)
AST: 14 U/L (ref 0–37)
Albumin: 4.2 g/dL (ref 3.5–5.2)
Alkaline Phosphatase: 38 U/L — ABNORMAL LOW (ref 39–117)
BUN: 16 mg/dL (ref 6–23)
CO2: 30 mEq/L (ref 19–32)
Calcium: 9.4 mg/dL (ref 8.4–10.5)
Chloride: 104 mEq/L (ref 96–112)
Creatinine, Ser: 0.91 mg/dL (ref 0.40–1.50)
GFR: 89.43 mL/min (ref >60.00)
Glucose, Bld: 120 mg/dL — ABNORMAL HIGH (ref 70–99)
Potassium: 3.7 mEq/L (ref 3.5–5.1)
Sodium: 142 mEq/L (ref 135–145)
Total Bilirubin: 0.6 mg/dL (ref 0.2–1.2)
Total Protein: 6.3 g/dL (ref 6.0–8.3)

## 2020-05-01 LAB — LIPID PANEL
Cholesterol: 151 mg/dL (ref 0–200)
HDL: 46.5 mg/dL (ref >39.00)
LDL Cholesterol: 83 mg/dL (ref 0–99)
NonHDL: 104.6
Total CHOL/HDL Ratio: 3
Triglycerides: 107 mg/dL (ref 0.0–149.0)
VLDL: 21.4 mg/dL (ref 0.0–40.0)

## 2020-05-01 LAB — HEMOGLOBIN A1C: Hgb A1c MFr Bld: 6.1 % (ref 4.6–6.5)

## 2020-05-01 LAB — PSA: PSA: 1.32 ng/mL (ref 0.10–4.00)

## 2020-05-01 NOTE — Progress Notes (Signed)
No critical labs need to be addressed urgently. We will discuss labs in detail at upcoming office visit.   

## 2020-05-08 ENCOUNTER — Encounter: Payer: Self-pay | Admitting: Family Medicine

## 2020-05-08 ENCOUNTER — Other Ambulatory Visit: Payer: Self-pay

## 2020-05-08 ENCOUNTER — Ambulatory Visit (INDEPENDENT_AMBULATORY_CARE_PROVIDER_SITE_OTHER): Payer: BLUE CROSS/BLUE SHIELD | Admitting: Family Medicine

## 2020-05-08 VITALS — BP 120/72 | HR 77 | Temp 98.3°F | Ht 66.75 in | Wt 152.5 lb

## 2020-05-08 DIAGNOSIS — R7303 Prediabetes: Secondary | ICD-10-CM

## 2020-05-08 DIAGNOSIS — Z0000 Encounter for general adult medical examination without abnormal findings: Secondary | ICD-10-CM | POA: Diagnosis not present

## 2020-05-08 DIAGNOSIS — I10 Essential (primary) hypertension: Secondary | ICD-10-CM | POA: Diagnosis not present

## 2020-05-08 DIAGNOSIS — E7800 Pure hypercholesterolemia, unspecified: Secondary | ICD-10-CM | POA: Diagnosis not present

## 2020-05-08 DIAGNOSIS — F325 Major depressive disorder, single episode, in full remission: Secondary | ICD-10-CM

## 2020-05-08 MED ORDER — SILDENAFIL CITRATE 100 MG PO TABS: 100.0000 mg | ORAL_TABLET | Freq: Every day | ORAL | 0 refills | Status: AC | PRN

## 2020-05-08 NOTE — Progress Notes (Signed)
Patient ID: Andrew Farrell, male    DOB: March 19, 1955, 65 y.o.   MRN: [id number]  This visit was conducted in person.  BP 120/72   Pulse 77   Temp 98.3 F (36.8 C) (Temporal)   Ht 5' 6.75" (1.695 m)   Wt 152 lb 8 oz (69.2 kg)   SpO2 97%   BMI 24.06 kg/m    CC:  Chief Complaint  Patient presents with  . Annual Exam    Subjective:   HPI: Andrew Farrell is a 65 y.o. male presenting on 05/08/2020 for Annual Exam  MDD, in remission. Doing well retired.   Occ food getting stuck... liquid and solid.  No heartburn , no reflux.  Slowing down eating helped some.  Hypertension:   At goal on lisinopril HCTZ  BP Readings from Last 3 Encounters:  05/08/20 120/72  11/19/18 118/60  11/12/17 130/66  Using medication without problems or lightheadedness:   occ Chest pain with exertion: none Edema:none Short of breath:none Average home BPs: Other issues:  Elevated Cholesterol:  LDL improved significantly with lifestyle changes! Now at goal < 100 Lab Results  Component Value Date   CHOL 151 05/01/2020   HDL 46.50 05/01/2020   LDLCALC 83 05/01/2020   LDLDIRECT 152.6 08/15/2009   TRIG 107.0 05/01/2020   CHOLHDL 3 05/01/2020  Using medications without problems: Muscle aches:  Diet compliance: low carb, low fat diet Exercise: very active Other complaints:  prediabetes, controlled with diet Lab Results  Component Value Date   HGBA1C 6.1 05/01/2020   Wt Readings from Last 3 Encounters:  05/08/20 152 lb 8 oz (69.2 kg)  11/19/18 149 lb 8 oz (67.8 kg)  11/12/17 163 lb 4 oz (74 kg)         Relevant past medical, surgical, family and social history reviewed and updated as indicated. Interim medical history since our last visit reviewed. Allergies and medications reviewed and updated. Outpatient Medications Prior to Visit  Medication Sig Dispense Refill  . lisinopril-hydrochlorothiazide (ZESTORETIC) 20-25 MG tablet Take 1/2 (one-half) tablet by mouth once daily 45  tablet 0  . Probiotic Product (PROBIOTIC PO) Take 1 tablet by mouth daily.    . sildenafil (VIAGRA) 100 MG tablet Take 100 mg by mouth daily as needed.    . testosterone cypionate (DEPOTESTOSTERONE CYPIONATE) 200 MG/ML injection Inject 200 mg into the muscle every 14 (fourteen) days.     No facility-administered medications prior to visit.     Per HPI unless specifically indicated in ROS section below Review of Systems  Constitutional: Negative for fatigue and fever.  HENT: Negative for ear pain.   Eyes: Negative for pain.  Respiratory: Negative for cough and shortness of breath.   Cardiovascular: Negative for chest pain, palpitations and leg swelling.  Gastrointestinal: Negative for abdominal pain.  Genitourinary: Negative for dysuria.  Musculoskeletal: Negative for arthralgias.  Neurological: Negative for syncope, light-headedness and headaches.  Psychiatric/Behavioral: Negative for dysphoric mood.   Objective:  BP 120/72   Pulse 77   Temp 98.3 F (36.8 C) (Temporal)   Ht 5' 6.75" (1.695 m)   Wt 152 lb 8 oz (69.2 kg)   SpO2 97%   BMI 24.06 kg/m   Wt Readings from Last 3 Encounters:  05/08/20 152 lb 8 oz (69.2 kg)  11/19/18 149 lb 8 oz (67.8 kg)  11/12/17 163 lb 4 oz (74 kg)      Physical Exam Constitutional:      Appearance: He is well-developed.  HENT:     Head: Normocephalic.     Right Ear: Hearing normal.     Left Ear: Hearing normal.     Nose: Nose normal.  Neck:     Thyroid: No thyroid mass or thyromegaly.     Vascular: No carotid bruit.     Trachea: Trachea normal.  Cardiovascular:     Rate and Rhythm: Normal rate and regular rhythm.     Pulses: Normal pulses.     Heart sounds: Heart sounds not distant. No murmur heard. No friction rub. No gallop.      Comments: No peripheral edema Pulmonary:     Effort: Pulmonary effort is normal. No respiratory distress.     Breath sounds: Normal breath sounds.  Skin:    General: Skin is warm and dry.      Findings: No rash.  Psychiatric:        Speech: Speech normal.        Behavior: Behavior normal.        Thought Content: Thought content normal.       Results for orders placed or performed in visit on 05/01/20  Hemoglobin A1c  Result Value Ref Range   Hgb A1c MFr Bld 6.1 4.6 - 6.5 %  Comprehensive metabolic panel  Result Value Ref Range   Sodium 142 135 - 145 mEq/L   Potassium 3.7 3.5 - 5.1 mEq/L   Chloride 104 96 - 112 mEq/L   CO2 30 19 - 32 mEq/L   Glucose, Bld 120 (H) 70 - 99 mg/dL   BUN 16 6 - 23 mg/dL   Creatinine, Ser 0.91 0.40 - 1.50 mg/dL   Total Bilirubin 0.6 0.2 - 1.2 mg/dL   Alkaline Phosphatase 38 (L) 39 - 117 U/L   AST 14 0 - 37 U/L   ALT 19 0 - 53 U/L   Total Protein 6.3 6.0 - 8.3 g/dL   Albumin 4.2 3.5 - 5.2 g/dL   GFR 89.43 >60.00 mL/min   Calcium 9.4 8.4 - 10.5 mg/dL  Lipid panel  Result Value Ref Range   Cholesterol 151 0 - 200 mg/dL   Triglycerides 107.0 0.0 - 149.0 mg/dL   HDL 46.50 >39.00 mg/dL   VLDL 21.4 0.0 - 40.0 mg/dL   LDL Cholesterol 83 0 - 99 mg/dL   Total CHOL/HDL Ratio 3    NonHDL 104.60   PSA  Result Value Ref Range   PSA 1.32 0.10 - 4.00 ng/mL    This visit occurred during the SARS-CoV-2 public health emergency.  Safety protocols were in place, including screening questions prior to the visit, additional usage of staff PPE, and extensive cleaning of exam room while observing appropriate contact time as indicated for disinfecting solutions.   COVID 19 screen:  No recent travel or known exposure to COVID19 The patient denies respiratory symptoms of COVID 19 at this time. The importance of social distancing was discussed today.   Assessment and Plan The patient's preventative maintenance and recommended screening tests for an annual wellness exam were reviewed in full today. Brought up to date unless services declined.  Counselled on the importance of diet, exercise, and its role in overall health and mortality. The patient's FH and  SH was reviewed, including their home life, tobacco status, and drug and alcohol status.     Colon: 2017 Dr. Gustavo Lah [facility],  05/2019.. repeat in 5 years PSA Lab Results  Component Value Date   PSA 1.32 05/01/2020   PSA 0.82 11/15/2018  PSA 1.05 11/09/2017  Former smoker Quit10years ago. Asymptomatic. Limit ETOH. Hep C screen.Neg Refused HIV screen Vaccines: COVID x 3, consider shingrix vaccine  Problem List Items Addressed This Visit    Depression, major, in remission ([place])    Resolved.      Essential hypertension, benign    Stable, chronic.  Continue current medication.   Lisinopril HCTZ 1/2 tab 20/25 mg daily      Relevant Medications   sildenafil (VIAGRA) 100 MG tablet   HYPERCHOLESTEROLEMIA    Diet controlled. Significant improvement in last year.      Relevant Medications   sildenafil (VIAGRA) 100 MG tablet   Prediabetes    Discussed low carb diet. Stable, chronic.  Continue current medication.          Other Visit Diagnoses    Routine general medical examination at a health care facility    -  Primary      Eliezer Lofts, MD

## 2020-05-08 NOTE — Assessment & Plan Note (Signed)
Resolved

## 2020-05-08 NOTE — Patient Instructions (Signed)
Follow BP at home.Marland Kitchen get arm cuff. Goal < 140/90. Check BP when feling dizzy.  Send measurements in next few weeks.

## 2020-05-08 NOTE — Assessment & Plan Note (Signed)
Stable, chronic.  Continue current medication.   Lisinopril HCTZ 1/2 tab 20/25 mg daily

## 2020-05-08 NOTE — Assessment & Plan Note (Signed)
Discussed low carb diet. Stable, chronic.  Continue current medication.

## 2020-05-08 NOTE — Assessment & Plan Note (Signed)
Diet controlled. Significant improvement in last year.

## 2020-06-27 DIAGNOSIS — E291 Testicular hypofunction: Secondary | ICD-10-CM | POA: Diagnosis not present

## 2020-06-27 DIAGNOSIS — N400 Enlarged prostate without lower urinary tract symptoms: Secondary | ICD-10-CM | POA: Diagnosis not present

## 2020-07-04 DIAGNOSIS — N5201 Erectile dysfunction due to arterial insufficiency: Secondary | ICD-10-CM | POA: Diagnosis not present

## 2020-07-04 DIAGNOSIS — E291 Testicular hypofunction: Secondary | ICD-10-CM | POA: Diagnosis not present

## 2020-07-04 DIAGNOSIS — N400 Enlarged prostate without lower urinary tract symptoms: Secondary | ICD-10-CM | POA: Diagnosis not present

## 2020-08-27 ENCOUNTER — Other Ambulatory Visit: Payer: Self-pay | Admitting: Family Medicine

## 2020-12-20 DIAGNOSIS — H2511 Age-related nuclear cataract, right eye: Secondary | ICD-10-CM | POA: Diagnosis not present

## 2020-12-24 ENCOUNTER — Encounter: Payer: Self-pay | Admitting: Ophthalmology

## 2020-12-24 DIAGNOSIS — H2511 Age-related nuclear cataract, right eye: Secondary | ICD-10-CM | POA: Diagnosis not present

## 2020-12-27 NOTE — Discharge Instructions (Signed)

## 2020-12-31 ENCOUNTER — Other Ambulatory Visit: Payer: Self-pay

## 2020-12-31 ENCOUNTER — Encounter: Payer: Self-pay | Admitting: Ophthalmology

## 2021-01-01 ENCOUNTER — Other Ambulatory Visit: Payer: Self-pay

## 2021-01-01 ENCOUNTER — Ambulatory Visit: Payer: BC Managed Care – PPO | Admitting: Anesthesiology

## 2021-01-01 ENCOUNTER — Encounter: Payer: Self-pay | Admitting: Ophthalmology

## 2021-01-01 ENCOUNTER — Ambulatory Visit
Admission: RE | Admit: 2021-01-01 | Discharge: 2021-01-01 | Disposition: A | Discharge status: Home / Self Care | Payer: BC Managed Care – PPO | Attending: Ophthalmology | Admitting: Ophthalmology

## 2021-01-01 ENCOUNTER — Encounter
Admission: RE | Disposition: A | Discharge status: Home / Self Care | Payer: Self-pay | Source: Home / Self Care | Attending: Ophthalmology

## 2021-01-01 DIAGNOSIS — Z87891 Personal history of nicotine dependence: Secondary | ICD-10-CM | POA: Insufficient documentation

## 2021-01-01 DIAGNOSIS — H2511 Age-related nuclear cataract, right eye: Secondary | ICD-10-CM | POA: Diagnosis not present

## 2021-01-01 HISTORY — DX: Unspecified osteoarthritis, unspecified site: M19.90

## 2021-01-01 HISTORY — PX: CATARACT EXTRACTION W/PHACO: SHX586

## 2021-01-01 SURGERY — PHACOEMULSIFICATION, CATARACT, WITH IOL INSERTION
Anesthesia: Monitor Anesthesia Care | Site: Eye | Laterality: Right

## 2021-01-01 MED ORDER — TETRACAINE HCL 0.5 % OP SOLN
OPHTHALMIC | Status: DC | PRN
  Administered 2021-01-01: 2 [drp] via OPHTHALMIC

## 2021-01-01 MED ORDER — SIGHTPATH DOSE#1 BSS IO SOLN
INTRAOCULAR | Status: DC | PRN
  Administered 2021-01-01: 12:00:00 60 mL via OPHTHALMIC

## 2021-01-01 MED ORDER — LACTATED RINGERS IV SOLN: INTRAVENOUS | Status: DC

## 2021-01-01 MED ORDER — MIDAZOLAM HCL 2 MG/2ML IJ SOLN
INTRAMUSCULAR | Status: DC | PRN
Start: 2021-01-01 — End: 2021-01-01
  Administered 2021-01-01: 2 mg via INTRAVENOUS

## 2021-01-01 MED ORDER — TETRACAINE HCL 0.5 % OP SOLN
1.0000 [drp] | OPHTHALMIC | Status: DC | PRN
  Administered 2021-01-01 (×2): 1 [drp] via OPHTHALMIC

## 2021-01-01 MED ORDER — SIGHTPATH DOSE#1 BSS IO SOLN
INTRAOCULAR | Status: DC | PRN
  Administered 2021-01-01: 15 mL

## 2021-01-01 MED ORDER — SIGHTPATH DOSE#1 BSS IO SOLN
INTRAOCULAR | Status: DC | PRN
  Administered 2021-01-01: 1 mL via INTRAMUSCULAR

## 2021-01-01 MED ORDER — MOXIFLOXACIN HCL 0.5 % OP SOLN
OPHTHALMIC | Status: DC | PRN
  Administered 2021-01-01: 0.2 mL via OPHTHALMIC

## 2021-01-01 MED ORDER — BRIMONIDINE TARTRATE-TIMOLOL 0.2-0.5 % OP SOLN
OPHTHALMIC | Status: DC | PRN
  Administered 2021-01-01: 1 [drp] via OPHTHALMIC

## 2021-01-01 MED ORDER — ARMC OPHTHALMIC DILATING DROPS
1.0000 "application " | OPHTHALMIC | Status: DC | PRN
  Administered 2021-01-01 (×3): 1 via OPHTHALMIC

## 2021-01-01 MED ORDER — FENTANYL CITRATE (PF) 100 MCG/2ML IJ SOLN
INTRAMUSCULAR | Status: DC | PRN
  Administered 2021-01-01: 50 ug via INTRAVENOUS

## 2021-01-01 MED ORDER — SIGHTPATH DOSE#1 NA CHONDROIT SULF-NA HYALURON 40-17 MG/ML IO SOLN
INTRAOCULAR | Status: DC | PRN
  Administered 2021-01-01: 1 mL via INTRAOCULAR

## 2021-01-01 SURGICAL SUPPLY — 8 items
GLOVE SURG ENC TEXT LTX SZ8 (GLOVE) ×4 IMPLANT
GLOVE SURG TRIUMPH 8.0 PF LTX (GLOVE) ×4 IMPLANT
LENS IOL TECNIS EYHANCE 21.5 (Intraocular Lens) ×2 IMPLANT
NDL FILTER BLUNT 18X1 1/2 (NEEDLE) ×2 IMPLANT
NEEDLE FILTER BLUNT 18X 1/2SAF (NEEDLE) ×2
NEEDLE FILTER BLUNT 18X1 1/2 (NEEDLE) ×1 IMPLANT
SYR 3ML LL SCALE MARK (SYRINGE) ×4 IMPLANT
WATER STERILE IRR 250ML POUR (IV SOLUTION) ×4 IMPLANT

## 2021-01-01 NOTE — H&P (Signed)
[place]   Primary Care Physician:  Jinny Sanders, MD Ophthalmologist: Dr. George Ina  Pre-Procedure History & Physical: HPI:  Andrew Farrell is a 65 y.o. male here for cataract surgery.   Past Medical History:  Diagnosis Date   Alcohol abuse    Anxiety    Arthritis    BPH (benign prostatic hyperplasia)    Depression    Fatty liver    Hyperlipidemia    Hypertension    Hypogonadism in male    Polycythemia     Past Surgical History:  Procedure Laterality Date   COLONOSCOPY WITH PROPOFOL N/A 03/23/2015   Procedure: COLONOSCOPY WITH PROPOFOL;  Surgeon: Lollie Sails, MD;  Location: [facility] ENDOSCOPY;  Service: Endoscopy;  Laterality: N/A;    Prior to Admission medications   Medication Sig Start Date End Date Taking? Authorizing Provider  Krill Oil 1000 MG CAPS Take by mouth daily.   Yes [provider]  lisinopril-hydrochlorothiazide (ZESTORETIC) 20-25 MG tablet Take 1/2 (one-half) tablet by mouth once daily 08/27/20  Yes Bedsole, Amy E, MD  Multiple Vitamins-Minerals (MEGAVITE FRUITS & VEGGIES PO) Take by mouth daily.   Yes [provider]  Probiotic Product (PROBIOTIC PO) Take 1 tablet by mouth daily.   Yes [provider]  testosterone cypionate (DEPOTESTOSTERONE CYPIONATE) 200 MG/ML injection Inject 200 mg into the muscle every 14 (fourteen) days.   Yes [provider]  sildenafil (VIAGRA) 100 MG tablet Take 1 tablet (100 mg total) by mouth daily as needed. 05/08/20   Jinny Sanders, MD    Allergies as of 12/21/2020 - Review Complete 05/08/2020  Allergen Reaction Noted   Amitriptyline Other (See Comments) 01/02/2017   Buspirone hcl  05/14/2006   Escitalopram oxalate      History reviewed. No pertinent family history.  Social History   Socioeconomic History   Marital status: Married    Spouse name: Not on file   Number of children: Not on file   Years of education: Not on file   Highest education level: Not on file   Occupational History   Not on file  Tobacco Use   Smoking status: Former    Types: Cigarettes    Quit date: 03/22/2009    Years since quitting: 11.7   Smokeless tobacco: Never  Substance and Sexual Activity   Alcohol use: Yes    Alcohol/week: 6.0 - 7.0 standard drinks    Types: 6 - 7 Cans of beer per week   Drug use: No   Sexual activity: Not on file  Other Topics Concern   Not on file  Social History Narrative   Married, Press photographer.    Social Determinants of Health   Financial Resource Strain: Not on file  Food Insecurity: Not on file  Transportation Needs: Not on file  Physical Activity: Not on file  Stress: Not on file  Social Connections: Not on file  Intimate Partner Violence: Not on file    Review of Systems: See HPI, otherwise negative ROS  Physical Exam: BP 128/73    Pulse 70    Temp (!) 97.2 F (36.2 C) (Temporal)    Ht 5\' 7"  (1.702 m)    Wt 71.7 kg    SpO2 98%    BMI 24.02 kg/m  General:   Alert, cooperative in NAD Head:  Normocephalic and atraumatic. Respiratory:  Normal work of breathing. Cardiovascular:  RRR  Impression/Plan: Andrew Farrell is here for cataract surgery.  Risks, benefits, limitations, and alternatives regarding  cataract surgery have been reviewed with the patient.  Questions have been answered.  All parties agreeable.   Birder Robson, MD  01/01/2021, 11:18 AM

## 2021-01-01 NOTE — Anesthesia Postprocedure Evaluation (Signed)
Anesthesia Post Note  Patient: Andrew Farrell  Procedure(s) Performed: CATARACT EXTRACTION PHACO AND INTRAOCULAR LENS PLACEMENT (IOC) RIGHT 8.53 00:52.8 (Right: Eye)     Patient location during evaluation: PACU Anesthesia Type: MAC Level of consciousness: awake and alert Pain management: pain level controlled Vital Signs Assessment: post-procedure vital signs reviewed and stable Respiratory status: spontaneous breathing and nonlabored ventilation Cardiovascular status: blood pressure returned to baseline Postop Assessment: no apparent nausea or vomiting Anesthetic complications: no   No notable events documented.  Maryl Blalock [employer]

## 2021-01-01 NOTE — Anesthesia Preprocedure Evaluation (Signed)
Anesthesia Evaluation  Patient identified by MRN, date of birth, ID band Patient awake    Reviewed: Allergy & Precautions, NPO status , Patient's Chart, lab work & pertinent test results  Airway Mallampati: II  TM Distance: >3 FB Neck ROM: Full    Dental no notable dental hx.    Pulmonary former smoker,    Pulmonary exam normal        Cardiovascular hypertension, Pt. on medications Normal cardiovascular exam     Neuro/Psych Anxiety Depression negative neurological ROS     GI/Hepatic negative GI ROS, Neg liver ROS,   Endo/Other  negative endocrine ROS  Renal/GU negative Renal ROS   BPH    Musculoskeletal  (+) Arthritis ,   Abdominal Normal abdominal exam  (+)   Peds  Hematology H/o polycythemia   Anesthesia Other Findings Cataract  Reproductive/Obstetrics                             Anesthesia Physical Anesthesia Plan  ASA: 2  Anesthesia Plan: MAC   Post-op Pain Management: Minimal or no pain anticipated   Induction: Intravenous  PONV Risk Score and Plan: 1 and TIVA, Midazolam and Treatment may vary due to age or medical condition  Airway Management Planned: Natural Airway and Nasal Cannula  Additional Equipment:   Intra-op Plan:   Post-operative Plan:   Informed Consent: I have reviewed the patients History and Physical, chart, labs and discussed the procedure including the risks, benefits and alternatives for the proposed anesthesia with the patient or authorized representative who has indicated his/her understanding and acceptance.     Dental advisory given  Plan Discussed with: CRNA  Anesthesia Plan Comments:         Anesthesia Quick Evaluation

## 2021-01-01 NOTE — Op Note (Signed)
PREOPERATIVE DIAGNOSIS:  Nuclear sclerotic cataract of the right eye.   POSTOPERATIVE DIAGNOSIS:  H25.11 Cataract   OPERATIVE PROCEDURE:ORPROCALL@   SURGEON:  Birder Robson, MD.   ANESTHESIA:  Anesthesiologist: Ardeth Sportsman, MD CRNA: Silvana Newness, CRNA  1.      Managed anesthesia care. 2.      0.29ml of Shugarcaine was instilled in the eye following the paracentesis.   COMPLICATIONS:  None.   TECHNIQUE:   Stop and chop   DESCRIPTION OF PROCEDURE:  The patient was examined and consented in the preoperative holding area where the aforementioned topical anesthesia was applied to the right eye and then brought back to the Operating Room where the right eye was prepped and draped in the usual sterile ophthalmic fashion and a lid speculum was placed. A paracentesis was created with the side port blade and the anterior chamber was filled with viscoelastic. A near clear corneal incision was performed with the steel keratome. A continuous curvilinear capsulorrhexis was performed with a cystotome followed by the capsulorrhexis forceps. Hydrodissection and hydrodelineation were carried out with BSS on a blunt cannula. The lens was removed in a stop and chop  technique and the remaining cortical material was removed with the irrigation-aspiration handpiece. The capsular bag was inflated with viscoelastic and the Technis [id number]  lens was placed in the capsular bag without complication. The remaining viscoelastic was removed from the eye with the irrigation-aspiration handpiece. The wounds were hydrated. The anterior chamber was flushed with BSS and the eye was inflated to physiologic pressure. 0.30ml of Vigamox was placed in the anterior chamber. The wounds were found to be water tight. The eye was dressed with Combigan. The patient was given protective glasses to wear throughout the day and a shield with which to sleep tonight. The patient was also given drops with which to begin a drop regimen today and  will follow-up with me in one day. Implant Name Type Inv. Item Serial No. Manufacturer Lot No. LRB No. Used Action  LENS IOL TECNIS EYHANCE 21.5 - [id number] Intraocular Lens LENS IOL TECNIS EYHANCE 21.5 [id number] JOHNSON   Right 1 Implanted   Procedure(s): CATARACT EXTRACTION PHACO AND INTRAOCULAR LENS PLACEMENT (IOC) RIGHT 8.53 00:52.8 (Right)  Electronically signed: Birder Robson 01/01/2021 11:46 AM

## 2021-01-01 NOTE — Transfer of Care (Signed)
Immediate Anesthesia Transfer of Care Note  Patient: Andrew Farrell  Procedure(s) Performed: CATARACT EXTRACTION PHACO AND INTRAOCULAR LENS PLACEMENT (IOC) RIGHT (Right: Eye)  Patient Location: PACU  Anesthesia Type: MAC  Level of Consciousness: awake, alert  and patient cooperative  Airway and Oxygen Therapy: Patient Spontanous Breathing and Patient connected to supplemental oxygen  Post-op Assessment: Post-op Vital signs reviewed, Patient's Cardiovascular Status Stable, Respiratory Function Stable, Patent Airway and No signs of Nausea or vomiting  Post-op Vital Signs: Reviewed and stable  Complications: No notable events documented.

## 2021-01-02 ENCOUNTER — Encounter: Payer: Self-pay | Admitting: Ophthalmology

## 2021-01-09 DIAGNOSIS — H2512 Age-related nuclear cataract, left eye: Secondary | ICD-10-CM | POA: Diagnosis not present

## 2021-01-09 NOTE — Discharge Instructions (Signed)

## 2021-01-15 ENCOUNTER — Other Ambulatory Visit: Payer: Self-pay

## 2021-01-15 ENCOUNTER — Ambulatory Visit: Payer: BC Managed Care – PPO | Admitting: Anesthesiology

## 2021-01-15 ENCOUNTER — Encounter
Admission: RE | Disposition: A | Discharge status: Home / Self Care | Payer: Self-pay | Source: Home / Self Care | Attending: Ophthalmology

## 2021-01-15 ENCOUNTER — Encounter: Payer: Self-pay | Admitting: Ophthalmology

## 2021-01-15 ENCOUNTER — Ambulatory Visit
Admission: RE | Admit: 2021-01-15 | Discharge: 2021-01-15 | Disposition: A | Discharge status: Home / Self Care | Payer: BC Managed Care – PPO | Attending: Ophthalmology | Admitting: Ophthalmology

## 2021-01-15 DIAGNOSIS — H2512 Age-related nuclear cataract, left eye: Secondary | ICD-10-CM | POA: Insufficient documentation

## 2021-01-15 DIAGNOSIS — Z87891 Personal history of nicotine dependence: Secondary | ICD-10-CM | POA: Diagnosis not present

## 2021-01-15 HISTORY — PX: CATARACT EXTRACTION W/PHACO: SHX586

## 2021-01-15 SURGERY — PHACOEMULSIFICATION, CATARACT, WITH IOL INSERTION
Anesthesia: Monitor Anesthesia Care | Site: Eye | Laterality: Left

## 2021-01-15 MED ORDER — TETRACAINE HCL 0.5 % OP SOLN
1.0000 [drp] | OPHTHALMIC | Status: DC | PRN
  Administered 2021-01-15 (×3): 1 [drp] via OPHTHALMIC

## 2021-01-15 MED ORDER — SIGHTPATH DOSE#1 NA CHONDROIT SULF-NA HYALURON 40-17 MG/ML IO SOLN
INTRAOCULAR | Status: DC | PRN
  Administered 2021-01-15: 1 mL via INTRAOCULAR

## 2021-01-15 MED ORDER — MOXIFLOXACIN HCL 0.5 % OP SOLN
OPHTHALMIC | Status: DC | PRN
  Administered 2021-01-15: 0.2 mL via OPHTHALMIC

## 2021-01-15 MED ORDER — ARMC OPHTHALMIC DILATING DROPS
1.0000 "application " | OPHTHALMIC | Status: DC | PRN
  Administered 2021-01-15 (×3): 1 via OPHTHALMIC

## 2021-01-15 MED ORDER — MIDAZOLAM HCL 2 MG/2ML IJ SOLN
INTRAMUSCULAR | Status: DC | PRN
  Administered 2021-01-15 (×2): 1 mg via INTRAVENOUS

## 2021-01-15 MED ORDER — BRIMONIDINE TARTRATE-TIMOLOL 0.2-0.5 % OP SOLN
OPHTHALMIC | Status: DC | PRN
  Administered 2021-01-15: 1 [drp] via OPHTHALMIC

## 2021-01-15 MED ORDER — SIGHTPATH DOSE#1 BSS IO SOLN
INTRAOCULAR | Status: DC | PRN
  Administered 2021-01-15: 1 mL via INTRAMUSCULAR

## 2021-01-15 MED ORDER — SIGHTPATH DOSE#1 BSS IO SOLN
INTRAOCULAR | Status: DC | PRN
  Administered 2021-01-15: 53 mL via OPHTHALMIC

## 2021-01-15 MED ORDER — FENTANYL CITRATE (PF) 100 MCG/2ML IJ SOLN
INTRAMUSCULAR | Status: DC | PRN
  Administered 2021-01-15: 50 ug via INTRAVENOUS

## 2021-01-15 MED ORDER — LACTATED RINGERS IV SOLN: INTRAVENOUS | Status: DC

## 2021-01-15 MED ORDER — SIGHTPATH DOSE#1 BSS IO SOLN
INTRAOCULAR | Status: DC | PRN
  Administered 2021-01-15: 15 mL

## 2021-01-15 SURGICAL SUPPLY — 10 items
CATARACT SUITE SIGHTPATH (MISCELLANEOUS) ×2 IMPLANT
FEE CATARACT SUITE SIGHTPATH (MISCELLANEOUS) IMPLANT
GLOVE SURG ENC TEXT LTX SZ8 (GLOVE) ×3 IMPLANT
GLOVE SURG TRIUMPH 8.0 PF LTX (GLOVE) ×3 IMPLANT
LENS IOL TECNIS EYHANCE 20.5 (Intraocular Lens) ×1 IMPLANT
NDL FILTER BLUNT 18X1 1/2 (NEEDLE) ×2 IMPLANT
NEEDLE FILTER BLUNT 18X 1/2SAF (NEEDLE) ×1
NEEDLE FILTER BLUNT 18X1 1/2 (NEEDLE) ×1 IMPLANT
SYR 3ML LL SCALE MARK (SYRINGE) ×3 IMPLANT
WATER STERILE IRR 250ML POUR (IV SOLUTION) ×3 IMPLANT

## 2021-01-15 NOTE — H&P (Signed)
[place]   Primary Care Physician:  Jinny Sanders, MD Ophthalmologist: Dr. George Ina  Pre-Procedure History & Physical: HPI:  Andrew Farrell is a 66 y.o. male here for cataract surgery.   Past Medical History:  Diagnosis Date   Alcohol abuse    Anxiety    Arthritis    BPH (benign prostatic hyperplasia)    Depression    Fatty liver    Hyperlipidemia    Hypertension    Hypogonadism in male    Polycythemia     Past Surgical History:  Procedure Laterality Date   CATARACT EXTRACTION W/PHACO Right 01/01/2021   Procedure: CATARACT EXTRACTION PHACO AND INTRAOCULAR LENS PLACEMENT ([place]) RIGHT 8.53 00:52.8;  Surgeon: Birder Robson, MD;  Location: [facility];  Service: Ophthalmology;  Laterality: Right;   COLONOSCOPY WITH PROPOFOL N/A 03/23/2015   Procedure: COLONOSCOPY WITH PROPOFOL;  Surgeon: Lollie Sails, MD;  Location: [facility] ENDOSCOPY;  Service: Endoscopy;  Laterality: N/A;    Prior to Admission medications   Medication Sig Start Date End Date Taking? Authorizing Provider  Krill Oil 1000 MG CAPS Take by mouth daily.   Yes [provider]  lisinopril-hydrochlorothiazide (ZESTORETIC) 20-25 MG tablet Take 1/2 (one-half) tablet by mouth once daily 08/27/20  Yes Bedsole, Amy E, MD  Multiple Vitamins-Minerals (MEGAVITE FRUITS & VEGGIES PO) Take by mouth daily.   Yes [provider]  Probiotic Product (PROBIOTIC PO) Take 1 tablet by mouth daily.   Yes [provider]  sildenafil (VIAGRA) 100 MG tablet Take 1 tablet (100 mg total) by mouth daily as needed. 05/08/20  Yes Bedsole, Amy E, MD  testosterone cypionate (DEPOTESTOSTERONE CYPIONATE) 200 MG/ML injection Inject 200 mg into the muscle every 14 (fourteen) days.    [provider]    Allergies as of 12/21/2020 - Review Complete 05/08/2020  Allergen Reaction Noted   Amitriptyline Other (See Comments) 01/02/2017   Buspirone hcl  05/14/2006   Escitalopram oxalate       History reviewed. No pertinent family history.  Social History   Socioeconomic History   Marital status: Married    Spouse name: Not on file   Number of children: Not on file   Years of education: Not on file   Highest education level: Not on file  Occupational History   Not on file  Tobacco Use   Smoking status: Former    Types: Cigarettes    Quit date: 03/22/2009    Years since quitting: 11.8   Smokeless tobacco: Never  Substance and Sexual Activity   Alcohol use: Yes    Alcohol/week: 6.0 - 7.0 standard drinks    Types: 6 - 7 Cans of beer per week   Drug use: No   Sexual activity: Not on file  Other Topics Concern   Not on file  Social History Narrative   Married, Press photographer.    Social Determinants of Health   Financial Resource Strain: Not on file  Food Insecurity: Not on file  Transportation Needs: Not on file  Physical Activity: Not on file  Stress: Not on file  Social Connections: Not on file  Intimate Partner Violence: Not on file    Review of Systems: See HPI, otherwise negative ROS  Physical Exam: BP (!) 156/68    Pulse 85    Temp (!) 97.4 F (36.3 C) (Temporal)    Ht 5\' 8"  (1.727 m)    Wt 71.7 kg    SpO2 98%    BMI 24.02 kg/m  General:   Alert, cooperative in NAD Head:  Normocephalic and atraumatic. Respiratory:  Normal work of breathing. Cardiovascular:  RRR  Impression/Plan: Andrew Farrell is here for cataract surgery.  Risks, benefits, limitations, and alternatives regarding cataract surgery have been reviewed with the patient.  Questions have been answered.  All parties agreeable.   Birder Robson, MD  01/15/2021, 8:40 AM

## 2021-01-15 NOTE — Anesthesia Postprocedure Evaluation (Signed)
Anesthesia Post Note  Patient: Andrew Farrell  Procedure(s) Performed: CATARACT EXTRACTION PHACO AND INTRAOCULAR LENS PLACEMENT (IOC) LEFT (Left: Eye)     Patient location during evaluation: PACU Anesthesia Type: MAC Level of consciousness: awake and alert Pain management: pain level controlled Vital Signs Assessment: post-procedure vital signs reviewed and stable Respiratory status: spontaneous breathing, nonlabored ventilation and respiratory function stable Cardiovascular status: stable and blood pressure returned to baseline Postop Assessment: no apparent nausea or vomiting Anesthetic complications: no   No notable events documented.  April Manson

## 2021-01-15 NOTE — Anesthesia Preprocedure Evaluation (Addendum)
Anesthesia Evaluation  Patient identified by MRN, date of birth, ID band Patient awake    Reviewed: Allergy & Precautions, H&P , NPO status , Patient's Chart, lab work & pertinent test results, reviewed documented beta blocker date and time   Airway Mallampati: II  TM Distance: >3 FB Neck ROM: full    Dental no notable dental hx.    Pulmonary neg pulmonary ROS, former smoker,    Pulmonary exam normal breath sounds clear to auscultation       Cardiovascular Exercise Tolerance: Good hypertension, negative cardio ROS   Rhythm:regular Rate:Normal     Neuro/Psych Anxiety negative neurological ROS  negative psych ROS   GI/Hepatic negative GI ROS, FLD   Endo/Other  negative endocrine ROS  Renal/GU negative Renal ROS  negative genitourinary   Musculoskeletal   Abdominal   Peds  Hematology negative hematology ROS (+)   Anesthesia Other Findings   Reproductive/Obstetrics negative OB ROS                             Anesthesia Physical Anesthesia Plan  ASA: 2  Anesthesia Plan: MAC   Post-op Pain Management:    Induction:   PONV Risk Score and Plan: 1 and Midazolam, TIVA and Treatment may vary due to age or medical condition  Airway Management Planned:   Additional Equipment:   Intra-op Plan:   Post-operative Plan:   Informed Consent: I have reviewed the patients History and Physical, chart, labs and discussed the procedure including the risks, benefits and alternatives for the proposed anesthesia with the patient or authorized representative who has indicated his/her understanding and acceptance.     Dental Advisory Given  Plan Discussed with: CRNA  Anesthesia Plan Comments:         Anesthesia Quick Evaluation

## 2021-01-15 NOTE — Op Note (Signed)
PREOPERATIVE DIAGNOSIS:  Nuclear sclerotic cataract of the left eye.   POSTOPERATIVE DIAGNOSIS:  Nuclear sclerotic cataract of the left eye.   OPERATIVE PROCEDURE:ORPROCALL@   SURGEON:  Birder Robson, MD.   ANESTHESIA:  Anesthesiologist: April Manson, MD CRNA: Cameron Ali, CRNA  1.      Managed anesthesia care. 2.     0.50ml of Shugarcaine was instilled following the paracentesis   COMPLICATIONS:  None.   TECHNIQUE:   Stop and chop   DESCRIPTION OF PROCEDURE:  The patient was examined and consented in the preoperative holding area where the aforementioned topical anesthesia was applied to the left eye and then brought back to the Operating Room where the left eye was prepped and draped in the usual sterile ophthalmic fashion and a lid speculum was placed. A paracentesis was created with the side port blade and the anterior chamber was filled with viscoelastic. A near clear corneal incision was performed with the steel keratome. A continuous curvilinear capsulorrhexis was performed with a cystotome followed by the capsulorrhexis forceps. Hydrodissection and hydrodelineation were carried out with BSS on a blunt cannula. The lens was removed in a stop and chop  technique and the remaining cortical material was removed with the irrigation-aspiration handpiece. The capsular bag was inflated with viscoelastic and the Technis [id number] lens was placed in the capsular bag without complication. The remaining viscoelastic was removed from the eye with the irrigation-aspiration handpiece. The wounds were hydrated. The anterior chamber was flushed with BSS and the eye was inflated to physiologic pressure. 0.35ml Vigamox was placed in the anterior chamber. The wounds were found to be water tight. The eye was dressed with Combigan. The patient was given protective glasses to wear throughout the day and a shield with which to sleep tonight. The patient was also given drops with which to begin a drop regimen  today and will follow-up with me in one day. Implant Name Type Inv. Item Serial No. Manufacturer Lot No. LRB No. Used Action  LENS IOL TECNIS EYHANCE 20.5 - [id number] Intraocular Lens LENS IOL TECNIS EYHANCE 20.5 [id number] JOHNSON   Left 1 Implanted    Procedure(s) with comments: CATARACT EXTRACTION PHACO AND INTRAOCULAR LENS PLACEMENT (IOC) LEFT (Left) - 3.67 00:29.1  Electronically signed: Birder Robson 01/15/2021 9:04 AM

## 2021-01-15 NOTE — Transfer of Care (Signed)
Immediate Anesthesia Transfer of Care Note  Patient: Andrew Farrell  Procedure(s) Performed: CATARACT EXTRACTION PHACO AND INTRAOCULAR LENS PLACEMENT (IOC) LEFT (Left: Eye)  Patient Location: PACU  Anesthesia Type: MAC  Level of Consciousness: awake, alert  and patient cooperative  Airway and Oxygen Therapy: Patient Spontanous Breathing and Patient connected to supplemental oxygen  Post-op Assessment: Post-op Vital signs reviewed, Patient's Cardiovascular Status Stable, Respiratory Function Stable, Patent Airway and No signs of Nausea or vomiting  Post-op Vital Signs: Reviewed and stable  Complications: No notable events documented.

## 2021-01-15 NOTE — Anesthesia Procedure Notes (Signed)
Procedure Name: MAC Date/Time: 01/15/2021 9:48 AM Performed by: Cameron Ali, CRNA Pre-anesthesia Checklist: Patient identified, Emergency Drugs available, Suction available, Timeout performed and Patient being monitored Patient Re-evaluated:Patient Re-evaluated prior to induction Oxygen Delivery Method: Nasal cannula Placement Confirmation: positive ETCO2

## 2021-01-16 ENCOUNTER — Encounter: Payer: Self-pay | Admitting: Ophthalmology

## 2022-02-11 LAB — HM DIABETES EYE EXAM

## 2022-03-20 ENCOUNTER — Encounter: Payer: Self-pay | Admitting: Family Medicine

## 2022-03-20 ENCOUNTER — Ambulatory Visit (INDEPENDENT_AMBULATORY_CARE_PROVIDER_SITE_OTHER): Payer: Medicare Other | Admitting: Family Medicine

## 2022-03-20 VITALS — BP 134/72 | HR 72 | Temp 97.9°F | Ht 66.75 in | Wt 159.0 lb

## 2022-03-20 DIAGNOSIS — R051 Acute cough: Secondary | ICD-10-CM | POA: Diagnosis not present

## 2022-03-20 MED ORDER — PREDNISONE 20 MG PO TABS: ORAL_TABLET | ORAL | 0 refills | Status: DC

## 2022-03-20 NOTE — Assessment & Plan Note (Signed)
Acute, persistent cough most likely postinfectious bronchospasm  No clear sign of bacterial infection. Symptoms likely secondary to initial viral infection now with reactive bronchospasm. Will treat with prednisone taper. Call if new fever late in illness or worsening symptoms. Go to the emergency room for severe shortness of breath

## 2022-03-20 NOTE — Patient Instructions (Signed)
No clear sign of bacterial infection. Symptoms likely secondary to initial viral infection now with reactive bronchospasm. Will treat with prednisone taper. Call if new fever late in illness or worsening symptoms. Go to the emergency room for severe shortness of breath

## 2022-03-20 NOTE — Progress Notes (Signed)
Patient ID: Andrew Farrell, male    DOB: 06-05-1955, 67 y.o.   MRN: [id number]  This visit was conducted in person.  BP 134/72 (BP Location: Left Arm, Patient Position: Sitting, Cuff Size: Large)   Pulse 72   Temp 97.9 F (36.6 C)   Ht 5' 6.75" (1.695 m)   Wt 159 lb (72.1 kg)   SpO2 97%   BMI 25.09 kg/m    CC:  Chief Complaint  Patient presents with   Chest Congestion    Started about 2.5 weeks ago. Had an URI. States everything cleared up except his chest. Does not have a cough. Did not do home covid test.     Subjective:   HPI: CHRISTOPHR MILETO is a 67 y.o. male  with HTN presenting on 03/20/2022 for Chest Congestion (Started about 2.5 weeks ago. Had an URI. States everything cleared up except his chest. Does not have a cough. Did not do home covid test. )    Date of onset: 2 to 3 weeks ago Initial symptoms included congestion and cough. Initial symptoms improved except for continued cough.  He continues to have productive white phelegm at night.  NO SOB, occ wheeze at night   Does a lot of outdoor   Sick contacts:  none  COVID home test negative for wife:none    He has tried to treat with  mucinex and Xyzal at night  Using afrin prn.    No history of chronic lung disease such as asthma or COPD.  Former smoker.       Relevant past medical, surgical, family and social history reviewed and updated as indicated. Interim medical history since our last visit reviewed. Allergies and medications reviewed and updated. Outpatient Medications Prior to Visit  Medication Sig Dispense Refill   Multiple Vitamins-Minerals (MEGAVITE FRUITS & VEGGIES PO) Take by mouth daily.     Probiotic Product (PROBIOTIC PO) Take 1 tablet by mouth daily.     sildenafil (VIAGRA) 100 MG tablet Take 1 tablet (100 mg total) by mouth daily as needed. 30 tablet 0   testosterone cypionate (DEPOTESTOSTERONE CYPIONATE) 200 MG/ML injection Inject 200 mg into the muscle every 14 (fourteen)  days.     Krill Oil 1000 MG CAPS Take by mouth daily.     lisinopril-hydrochlorothiazide (ZESTORETIC) 20-25 MG tablet Take 1/2 (one-half) tablet by mouth once daily 45 tablet 1   No facility-administered medications prior to visit.     Per HPI unless specifically indicated in ROS section below Review of Systems  Constitutional:  Negative for fatigue and fever.  HENT:  Negative for ear pain.   Eyes:  Negative for pain.  Respiratory:  Positive for cough and wheezing. Negative for shortness of breath.   Cardiovascular:  Negative for chest pain, palpitations and leg swelling.  Gastrointestinal:  Negative for abdominal pain.  Genitourinary:  Negative for dysuria.  Musculoskeletal:  Negative for arthralgias.  Neurological:  Negative for syncope, light-headedness and headaches.  Psychiatric/Behavioral:  Negative for dysphoric mood.    Objective:  BP 134/72 (BP Location: Left Arm, Patient Position: Sitting, Cuff Size: Large)   Pulse 72   Temp 97.9 F (36.6 C)   Ht 5' 6.75" (1.695 m)   Wt 159 lb (72.1 kg)   SpO2 97%   BMI 25.09 kg/m   Wt Readings from Last 3 Encounters:  03/20/22 159 lb (72.1 kg)  01/15/21 158 lb (71.7 kg)  01/01/21 158 lb (71.7 kg)  Physical Exam Constitutional:      Appearance: He is well-developed.  HENT:     Head: Normocephalic.     Right Ear: Hearing normal.     Left Ear: Hearing normal.     Nose: Nose normal.  Neck:     Thyroid: No thyroid mass or thyromegaly.     Vascular: No carotid bruit.     Trachea: Trachea normal.  Cardiovascular:     Rate and Rhythm: Normal rate and regular rhythm.     Pulses: Normal pulses.     Heart sounds: Heart sounds not distant. No murmur heard.    No friction rub. No gallop.     Comments: No peripheral edema Pulmonary:     Effort: Pulmonary effort is normal. No respiratory distress.     Breath sounds: Normal breath sounds.  Skin:    General: Skin is warm and dry.     Findings: No rash.  Psychiatric:         Speech: Speech normal.        Behavior: Behavior normal.        Thought Content: Thought content normal.       Results for orders placed or performed in visit on 02/26/22  HM DIABETES EYE EXAM  Result Value Ref Range   HM Diabetic Eye Exam No Retinopathy No Retinopathy    Assessment and Plan  Acute cough Assessment & Plan: Acute, persistent cough most likely postinfectious bronchospasm  No clear sign of bacterial infection. Symptoms likely secondary to initial viral infection now with reactive bronchospasm. Will treat with prednisone taper. Call if new fever late in illness or worsening symptoms. Go to the emergency room for severe shortness of breath   Other orders -     predniSONE; 3 tabs by mouth daily x 3 days, then 2 tabs by mouth daily x 2 days then 1 tab by mouth daily x 2 days  Dispense: 15 tablet; Refill: 0    Return if symptoms worsen or fail to improve.   Eliezer Lofts, MD

## 2023-08-11 ENCOUNTER — Ambulatory Visit: Payer: Self-pay

## 2023-08-11 NOTE — Telephone Encounter (Signed)
 FYI Only or Action Required?: FYI only for provider. Advised UC, no appts in clinic for today.   Patient was last seen in primary care on 03/20/2022 by Avelina Greig BRAVO, MD.  Called Nurse Triage reporting Insect Bite.  Symptoms began several days ago.  Interventions attempted: Nothing.  Symptoms are: gradually worsening.  Triage Disposition: See HCP Within 4 Hours (Or PCP Triage)  Patient/caregiver understands and will follow disposition?: Yes, will follow disposition  Copied from CRM #[id number]. Topic: Clinical - Red Word Triage >> Aug 11, 2023  8:58 AM Gennette ORN wrote: Red Word that prompted transfer to Nurse Triage: Patient stated he has a tick bite on his chest it's swollen and red. It's also getting bigger. Reason for Disposition  [1] Fever AND [2] spreading red area or streak  Answer Assessment - Initial Assessment Questions 1. ATTACHED:  Is the tick still on the skin?  (e.g., yes, no, unsure)     no 2. ONSET - TICK STILL ATTACHED:  How long do you think the tick has been on your skin? (e.g., hours, days, unsure)  Note:  Is there a recent activity (camping, hiking) where the caller may have been exposed?     denies 3. ONSET - TICK NOT STILL ATTACHED: If the tick has been removed, how long do you think the tick was attached before you removed it? (e.g., 5 hours, 2 days). When was this?     States he never took a tick off, states that he has a bulls-eye 4. LOCATION: Where is the tick bite located? (e.g., arm, leg)     chest 5. TYPE of TICK: Is it a wood tick or a deer tick? (e.g., deer tick, wood tick; unsure)     Unsure didn't see the tick 6. SIZE of TICK: How big is the tick? (e.g., size of poppy seed, apple seed, watermelon seed; unsure) Note: Deer ticks can be the size of a poppy seed (nymph) or an apple seed (adult).       Unsure didn't see tick 7. ENGORGED: Did the tick look flat or engorged (full, swollen)? (e.g., flat, engorged; unsure)     Unsure didn't see  tick 8. OTHER SYMPTOMS: Do you have any other symptoms? (e.g., fever, rash, redness at bite area, red ring around bite)     Redness, feels achy  Protocols used: Tick Bite-A-AH

## 2023-08-11 NOTE — Telephone Encounter (Signed)
 Noted.  Patient to go to urgent care for eval.

## 2023-08-21 ENCOUNTER — Ambulatory Visit: Admitting: Family Medicine

## 2023-08-21 ENCOUNTER — Ambulatory Visit: Payer: Self-pay | Admitting: Family Medicine

## 2023-08-21 ENCOUNTER — Encounter: Payer: Self-pay | Admitting: Family Medicine

## 2023-08-21 VITALS — BP 126/74 | HR 60 | Temp 97.8°F | Ht 66.5 in | Wt 153.1 lb

## 2023-08-21 DIAGNOSIS — R7303 Prediabetes: Secondary | ICD-10-CM

## 2023-08-21 DIAGNOSIS — R7989 Other specified abnormal findings of blood chemistry: Secondary | ICD-10-CM

## 2023-08-21 DIAGNOSIS — Z125 Encounter for screening for malignant neoplasm of prostate: Secondary | ICD-10-CM | POA: Diagnosis not present

## 2023-08-21 DIAGNOSIS — A6920 Lyme disease, unspecified: Secondary | ICD-10-CM | POA: Diagnosis not present

## 2023-08-21 DIAGNOSIS — E7800 Pure hypercholesterolemia, unspecified: Secondary | ICD-10-CM

## 2023-08-21 LAB — CBC WITH DIFFERENTIAL/PLATELET
Basophils Absolute: 0.1 K/uL (ref 0.0–0.1)
Basophils Relative: 1 % (ref 0.0–3.0)
Eosinophils Absolute: 0.1 K/uL (ref 0.0–0.7)
Eosinophils Relative: 1.7 % (ref 0.0–5.0)
HCT: 48.3 % (ref 39.0–52.0)
Hemoglobin: 16.1 g/dL (ref 13.0–17.0)
Lymphocytes Relative: 21.8 % (ref 12.0–46.0)
Lymphs Abs: 1.3 K/uL (ref 0.7–4.0)
MCHC: 33.3 g/dL (ref 30.0–36.0)
MCV: 95.2 fl (ref 78.0–100.0)
Monocytes Absolute: 0.5 K/uL (ref 0.1–1.0)
Monocytes Relative: 8.4 % (ref 3.0–12.0)
Neutro Abs: 4 K/uL (ref 1.4–7.7)
Neutrophils Relative %: 67.1 % (ref 43.0–77.0)
Platelets: 231 K/uL (ref 150.0–400.0)
RBC: 5.07 Mil/uL (ref 4.22–5.81)
RDW: 13.9 % (ref 11.5–15.5)
WBC: 6 K/uL (ref 4.0–10.5)

## 2023-08-21 LAB — COMPREHENSIVE METABOLIC PANEL WITH GFR
ALT: 51 U/L (ref 0–53)
AST: 24 U/L (ref 0–37)
Albumin: 4.6 g/dL (ref 3.5–5.2)
Alkaline Phosphatase: 48 U/L (ref 39–117)
BUN: 24 mg/dL — ABNORMAL HIGH (ref 6–23)
CO2: 31 meq/L (ref 19–32)
Calcium: 9.8 mg/dL (ref 8.4–10.5)
Chloride: 103 meq/L (ref 96–112)
Creatinine, Ser: 0.84 mg/dL (ref 0.40–1.50)
GFR: 90.41 mL/min (ref >60.00)
Glucose, Bld: 122 mg/dL — ABNORMAL HIGH (ref 70–99)
Potassium: 5 meq/L (ref 3.5–5.1)
Sodium: 144 meq/L (ref 135–145)
Total Bilirubin: 0.4 mg/dL (ref 0.2–1.2)
Total Protein: 6.7 g/dL (ref 6.0–8.3)

## 2023-08-21 LAB — LIPID PANEL
Cholesterol: 219 mg/dL — ABNORMAL HIGH (ref 0–200)
HDL: 84.1 mg/dL (ref >39.00)
LDL Cholesterol: 122 mg/dL — ABNORMAL HIGH (ref 0–99)
NonHDL: 135.35
Total CHOL/HDL Ratio: 3
Triglycerides: 69 mg/dL (ref 0.0–149.0)
VLDL: 13.8 mg/dL (ref 0.0–40.0)

## 2023-08-21 LAB — HEMOGLOBIN A1C: Hgb A1c MFr Bld: 6.2 % (ref 4.6–6.5)

## 2023-08-21 LAB — PSA, MEDICARE: PSA: 1.07 ng/mL (ref 0.10–4.00)

## 2023-08-21 NOTE — Progress Notes (Signed)
 No critical labs need to be addressed urgently. We will discuss labs in detail at upcoming office visit.

## 2023-08-21 NOTE — Progress Notes (Signed)
 Patient ID: Andrew Farrell, male    DOB: 12/22/1955, 68 y.o.   MRN: [id number]  This visit was conducted in person.  BP 126/74   Pulse 60   Temp 97.8 F (36.6 C) (Temporal)   Ht 5' 6.5 (1.689 m)   Wt 153 lb 2 oz (69.5 kg)   SpO2 97%   BMI 24.34 kg/m    CC:  Chief Complaint  Patient presents with   Insect Bite    Tick Bite.  [facility].  Rx Doxycycline     Subjective:   HPI: Andrew Farrell is a 68 y.o. male presenting on 08/21/2023 for Insect Bite (Tick Bite.  Seen at [facility].  Rx Doxycycline )   Recent tick bite 2-3 weeks ago.  Noted ring on upper left chest wall 10 days ago.  Flu like symptoms.  Treated with doxycycline .   Rash cleared up, no further flu like symptoms.  No fever.  Back to 100 %.  No joint joint pain, no neck pain.  No blood work done.    Overdue for physical.  Last done in 2022. Relevant past medical, surgical, family and social history reviewed and updated as indicated. Interim medical history since our last visit reviewed. Allergies and medications reviewed and updated. Outpatient Medications Prior to Visit  Medication Sig Dispense Refill   doxycycline  (VIBRAMYCIN ) 100 MG capsule Take 100 mg by mouth 2 (two) times daily.     OVER THE COUNTER MEDICATION Take 1 Package by mouth as directed. Relief Factor     sildenafil  (VIAGRA ) 100 MG tablet Take 1 tablet (100 mg total) by mouth daily as needed. 30 tablet 0   testosterone  cypionate (DEPOTESTOSTERONE CYPIONATE) 200 MG/ML injection Inject 200 mg into the muscle every 14 (fourteen) days.     Multiple Vitamins-Minerals (MEGAVITE FRUITS & VEGGIES PO) Take by mouth daily.     predniSONE  (DELTASONE ) 20 MG tablet 3 tabs by mouth daily x 3 days, then 2 tabs by mouth daily x 2 days then 1 tab by mouth daily x 2 days 15 tablet 0   Probiotic Product (PROBIOTIC PO) Take 1 tablet by mouth daily.     No facility-administered medications  prior to visit.     Per HPI unless specifically indicated in ROS section below Review of Systems  Constitutional:  Negative for fatigue and fever.  HENT:  Negative for ear pain.   Eyes:  Negative for pain.  Respiratory:  Negative for cough and shortness of breath.   Cardiovascular:  Negative for chest pain, palpitations and leg swelling.  Gastrointestinal:  Negative for abdominal pain.  Genitourinary:  Negative for dysuria.  Musculoskeletal:  Negative for arthralgias.  Neurological:  Negative for syncope, light-headedness and headaches.  Psychiatric/Behavioral:  Negative for dysphoric mood.    Objective:  BP 126/74   Pulse 60   Temp 97.8 F (36.6 C) (Temporal)   Ht 5' 6.5 (1.689 m)   Wt 153 lb 2 oz (69.5 kg)   SpO2 97%   BMI 24.34 kg/m   Wt Readings from Last 3 Encounters:  08/21/23 153 lb 2 oz (69.5 kg)  03/20/22 159 lb (72.1 kg)  01/15/21 158 lb (71.7 kg)      Physical Exam Vitals reviewed.  Constitutional:      Appearance: He is well-developed.  HENT:     Head: Normocephalic.     Right Ear: Hearing normal.     Left Ear: Hearing normal.  Nose: Nose normal.  Neck:     Thyroid: No thyroid mass or thyromegaly.     Vascular: No carotid bruit.     Trachea: Trachea normal.  Cardiovascular:     Rate and Rhythm: Normal rate and regular rhythm.     Pulses: Normal pulses.     Heart sounds: Heart sounds not distant. No murmur heard.    No friction rub. No gallop.     Comments: No peripheral edema Pulmonary:     Effort: Pulmonary effort is normal. No respiratory distress.     Breath sounds: Normal breath sounds.  Skin:    General: Skin is warm and dry.     Findings: Rash present.         Comments: Feeding 3 cm macule left upper chest  Psychiatric:        Speech: Speech normal.        Behavior: Behavior normal.        Thought Content: Thought content normal.       Results for orders placed or performed in visit on 02/26/22  HM DIABETES EYE EXAM    Collection Time: 02/11/22  2:42 PM  Result Value Ref Range   HM Diabetic Eye Exam No Retinopathy No Retinopathy    Assessment and Plan  Erythema migrans (Lyme disease) Assessment & Plan: Acute, patient with resolving rash and resolved systemic symptoms... Following course of doxycycline  100 mg p.o. twice daily x 10 days  (has a couple doses left) for presumed Lyme disease. Patient without neck stiffness, fever or joint pain.  Patient feels well and at baseline.  Return and ER precautions provided.   Prediabetes -     Hemoglobin A1c  HYPERCHOLESTEROLEMIA -     Lipid panel -     Comprehensive metabolic panel with GFR  Low testosterone  -     CBC with Differential/Platelet -     Testosterone , Free, Total, SHBG  Prostate cancer screening -     PSA, Medicare    Return in about 3 months (around 11/21/2023) for AMW/annual physical  NO LABS prior.   Greig Ring, MD

## 2023-08-21 NOTE — Assessment & Plan Note (Signed)
 Acute, patient with resolving rash and resolved systemic symptoms... Following course of doxycycline  100 mg p.o. twice daily x 10 days  (has a couple doses left) for presumed Lyme disease. Patient without neck stiffness, fever or joint pain.  Patient feels well and at baseline.  Return and ER precautions provided.

## 2023-08-25 ENCOUNTER — Encounter: Payer: Self-pay | Admitting: Family Medicine

## 2023-08-25 ENCOUNTER — Ambulatory Visit: Admitting: Family Medicine

## 2023-08-25 VITALS — BP 120/60 | HR 76 | Temp 98.0°F | Ht 66.5 in | Wt 152.1 lb

## 2023-08-25 DIAGNOSIS — F325 Major depressive disorder, single episode, in full remission: Secondary | ICD-10-CM

## 2023-08-25 DIAGNOSIS — R7303 Prediabetes: Secondary | ICD-10-CM

## 2023-08-25 DIAGNOSIS — E7800 Pure hypercholesterolemia, unspecified: Secondary | ICD-10-CM

## 2023-08-25 DIAGNOSIS — Z0000 Encounter for general adult medical examination without abnormal findings: Secondary | ICD-10-CM

## 2023-08-25 DIAGNOSIS — I10 Essential (primary) hypertension: Secondary | ICD-10-CM | POA: Diagnosis not present

## 2023-08-25 NOTE — Assessment & Plan Note (Signed)
Stable, chronic.  Continue current medication.   Lisinopril HCTZ 1/2 tab 20/25 mg daily

## 2023-08-25 NOTE — Assessment & Plan Note (Signed)
 Chronic, LDL no longer at goal less than 100

## 2023-08-25 NOTE — Assessment & Plan Note (Signed)
 Resolved

## 2023-08-25 NOTE — Assessment & Plan Note (Signed)
Discussed low carb diet. Stable, chronic.  Continue current medication.

## 2023-08-25 NOTE — Progress Notes (Signed)
 Patient ID: Andrew Farrell, male    DOB: 1955/07/04, 68 y.o.   MRN: [id number]  This visit was conducted in person.  BP 120/60   Pulse 76   Temp 98 F (36.7 C) (Temporal)   Ht 5' 6.5 (1.689 m)   Wt 152 lb 2 oz (69 kg)   SpO2 96%   BMI 24.19 kg/m    CC:  Chief Complaint  Patient presents with   Medicare Wellness    Subjective:   HPI: Andrew Farrell is a 68 y.o. male presenting on 08/25/2023 for Medicare Wellness The patient presents for annual medicare wellness, complete physical and review of chronic health problems. He/She also has the following acute concerns today: None  I have personally reviewed the Medicare Annual Wellness questionnaire and have noted 1. The patient's medical and social history 2. Their use of alcohol, tobacco or illicit drugs 3. Their current medications and supplements 4. The patient's functional ability including ADL's, fall risks, home safety risks and hearing or visual             impairment. 5. Diet and physical activities 6. Evidence for depression or mood disorders 7.         Updated provider list Cognitive evaluation was performed and recorded on pt medicare questionnaire form. The patients weight, height, BMI and visual acuity have been recorded in the chart   I have made referrals, counseling and provided education to the patient based review of the above and I have provided the pt with a written personalized care plan for preventive services.   Documentation of this information was scanned into the electronic record under the media tab.   Advance directives and end of life planning reviewed in detail with patient and documented in EMR. Patient given handout on advance care directives if needed. HCPOA and living will updated if needed.  No falls in last 12 months.  Hearing Screening  Method: Audiometry   500Hz  1000Hz  2000Hz  4000Hz   Right ear 20 20 20 20   Left ear 20 20 20 20   Vision Screening - Comments:: Wears Glasses-Eye  Exam with [facility] 3 months ago   Hypertension:   At goal on lisinopril  HCTZ  BP Readings from Last 3 Encounters:  08/25/23 120/60  08/21/23 126/74  03/20/22 134/72  Using medication without problems or lightheadedness:   occ Chest pain with exertion: none Edema:none Short of breath:none Average home BPs: Other issues:  Elevated Cholesterol:  LDL no longer at goal < 100 Lab Results  Component Value Date   CHOL 219 (H) 08/21/2023   HDL 84.10 08/21/2023   LDLCALC 122 (H) 08/21/2023   LDLDIRECT 152.6 08/15/2009   TRIG 69.0 08/21/2023   CHOLHDL 3 08/21/2023  Using medications without problems: Muscle aches:  Diet compliance: low carb, low fat diet Exercise: very active Other complaints: mother with CAD age 60.  prediabetes, controlled with diet Lab Results  Component Value Date   HGBA1C 6.2 08/21/2023   Wt Readings from Last 3 Encounters:  08/25/23 152 lb 2 oz (69 kg)  08/21/23 153 lb 2 oz (69.5 kg)  03/20/22 159 lb (72.1 kg)         Relevant past medical, surgical, family and social history reviewed and updated as indicated. Interim medical history since our last visit reviewed. Allergies and medications reviewed and updated. Outpatient Medications Prior to Visit  Medication Sig Dispense Refill   doxycycline  (VIBRAMYCIN ) 100 MG capsule Take 100 mg by mouth 2 (two) times  daily.     OVER THE COUNTER MEDICATION Take 1 Package by mouth as directed. Relief Factor     sildenafil  (VIAGRA ) 100 MG tablet Take 1 tablet (100 mg total) by mouth daily as needed. 30 tablet 0   testosterone  cypionate (DEPOTESTOSTERONE CYPIONATE) 200 MG/ML injection Inject 200 mg into the muscle every 14 (fourteen) days.     No facility-administered medications prior to visit.     Per HPI unless specifically indicated in ROS section below Review of Systems  Constitutional:  Negative for fatigue and fever.  HENT:  Negative for ear pain.   Eyes:  Negative for pain.  Respiratory:  Negative  for cough and shortness of breath.   Cardiovascular:  Negative for chest pain, palpitations and leg swelling.  Gastrointestinal:  Negative for abdominal pain.  Genitourinary:  Negative for dysuria.  Musculoskeletal:  Negative for arthralgias.  Neurological:  Negative for syncope, light-headedness and headaches.  Psychiatric/Behavioral:  Negative for dysphoric mood.    Objective:  BP 120/60   Pulse 76   Temp 98 F (36.7 C) (Temporal)   Ht 5' 6.5 (1.689 m)   Wt 152 lb 2 oz (69 kg)   SpO2 96%   BMI 24.19 kg/m   Wt Readings from Last 3 Encounters:  08/25/23 152 lb 2 oz (69 kg)  08/21/23 153 lb 2 oz (69.5 kg)  03/20/22 159 lb (72.1 kg)      Physical Exam Constitutional:      Appearance: He is well-developed.  HENT:     Head: Normocephalic.     Right Ear: Hearing normal.     Left Ear: Hearing normal.     Nose: Nose normal.  Neck:     Thyroid: No thyroid mass or thyromegaly.     Vascular: No carotid bruit.     Trachea: Trachea normal.  Cardiovascular:     Rate and Rhythm: Normal rate and regular rhythm.     Pulses: Normal pulses.     Heart sounds: Heart sounds not distant. No murmur heard.    No friction rub. No gallop.     Comments: No peripheral edema Pulmonary:     Effort: Pulmonary effort is normal. No respiratory distress.     Breath sounds: Normal breath sounds.  Skin:    General: Skin is warm and dry.     Findings: No rash.  Psychiatric:        Speech: Speech normal.        Behavior: Behavior normal.        Thought Content: Thought content normal.       Results for orders placed or performed in visit on 08/21/23  Hemoglobin A1c   Collection Time: 08/21/23 10:02 AM  Result Value Ref Range   Hgb A1c MFr Bld 6.2 4.6 - 6.5 %  Lipid panel   Collection Time: 08/21/23 10:02 AM  Result Value Ref Range   Cholesterol 219 (H) 0 - 200 mg/dL   Triglycerides 30.9 0.0 - 149.0 mg/dL   HDL 15.89 >60.99 mg/dL   VLDL 86.1 0.0 - 59.9 mg/dL   LDL Cholesterol 877 (H) 0 -  99 mg/dL   Total CHOL/HDL Ratio 3    NonHDL 135.35   Comprehensive metabolic panel with GFR   Collection Time: 08/21/23 10:02 AM  Result Value Ref Range   Sodium 144 135 - 145 mEq/L   Potassium 5.0 3.5 - 5.1 mEq/L   Chloride 103 96 - 112 mEq/L   CO2 31 19 - 32 mEq/L  Glucose, Bld 122 (H) 70 - 99 mg/dL   BUN 24 (H) 6 - 23 mg/dL   Creatinine, Ser 9.15 0.40 - 1.50 mg/dL   Total Bilirubin 0.4 0.2 - 1.2 mg/dL   Alkaline Phosphatase 48 39 - 117 U/L   AST 24 0 - 37 U/L   ALT 51 0 - 53 U/L   Total Protein 6.7 6.0 - 8.3 g/dL   Albumin 4.6 3.5 - 5.2 g/dL   GFR 09.58 >39.99 mL/min   Calcium 9.8 8.4 - 10.5 mg/dL  CBC with Differential/Platelet   Collection Time: 08/21/23 10:02 AM  Result Value Ref Range   WBC 6.0 4.0 - 10.5 K/uL   RBC 5.07 4.22 - 5.81 Mil/uL   Hemoglobin 16.1 13.0 - 17.0 g/dL   HCT 51.6 60.9 - 47.9 %   MCV 95.2 78.0 - 100.0 fl   MCHC 33.3 30.0 - 36.0 g/dL   RDW 86.0 88.4 - 84.4 %   Platelets 231.0 150.0 - 400.0 K/uL   Neutrophils Relative % 67.1 43.0 - 77.0 %   Lymphocytes Relative 21.8 12.0 - 46.0 %   Monocytes Relative 8.4 3.0 - 12.0 %   Eosinophils Relative 1.7 0.0 - 5.0 %   Basophils Relative 1.0 0.0 - 3.0 %   Neutro Abs 4.0 1.4 - 7.7 K/uL   Lymphs Abs 1.3 0.7 - 4.0 K/uL   Monocytes Absolute 0.5 0.1 - 1.0 K/uL   Eosinophils Absolute 0.1 0.0 - 0.7 K/uL   Basophils Absolute 0.1 0.0 - 0.1 K/uL  Testosterone , Free, Total, SHBG   Collection Time: 08/21/23 10:02 AM  Result Value Ref Range   Sex Hormone Binding 63.7 19.3 - 76.4 nmol/L  PSA, Medicare   Collection Time: 08/21/23 10:02 AM  Result Value Ref Range   PSA 1.07 0.10 - 4.00 ng/ml    This visit occurred during the SARS-CoV-2 public health emergency.  Safety protocols were in place, including screening questions prior to the visit, additional usage of staff PPE, and extensive cleaning of exam room while observing appropriate contact time as indicated for disinfecting solutions.   COVID 19 screen:  No  recent travel or known exposure to COVID19 The patient denies respiratory symptoms of COVID 19 at this time. The importance of social distancing was discussed today.   Assessment and Plan The patient's preventative maintenance and recommended screening tests for an annual wellness exam were reviewed in full today. Brought up to date unless services declined.  Counselled on the importance of diet, exercise, and its role in overall health and mortality. The patient's FH and SH was reviewed, including their home life, tobacco status, and drug and alcohol status.     Colon: 2017 [facility],  05/2019.. repeat in 5 years DUE in 2026 PSA  : Lab Results  Component Value Date   PSA 1.07 08/21/2023   PSA 1.32 05/01/2020   PSA 0.82 11/15/2018  Former smoker Quit 10 years ago. Asymptomatic.  Discussed AAA evaluation... he has decided to hold off despite increaed risk.  Limit ETOH.  Hep C screen. Neg  Refused HIV screen Vaccines: COVID x 3, consider  PNV, shingrix vaccine  Problem List Items Addressed This Visit     Depression, major, in remission (HCC)   Resolved.      Essential hypertension, benign   Stable, chronic.  Continue current medication.   Lisinopril  HCTZ 1/2 tab 20/25 mg daily      HYPERCHOLESTEROLEMIA   Chronic, LDL no longer at goal less than  100      Prediabetes   Discussed low carb diet. Stable, chronic.  Continue current medication.         Other Visit Diagnoses       Medicare annual wellness visit, subsequent    -  Primary        Greig Ring, MD

## 2023-08-25 NOTE — Patient Instructions (Signed)
 Work on low cholesterol diet ,  decrease alcohol, rice and bread.

## 2023-08-29 LAB — TESTOSTERONE, FREE, TOTAL, SHBG
Sex Hormone Binding: 63.7 nmol/L (ref 19.3–76.4)
Testosterone, Free: 2.9 pg/mL — ABNORMAL LOW (ref 6.6–18.1)
Testosterone: 293 ng/dL (ref 264–916)
